# Patient Record
Sex: Male | Born: 1955 | Hispanic: No | Marital: Married | Smoking: Never smoker
Health system: Southern US, Community
[De-identification: ages and names within clinical notes are randomized; demographics above are authoritative.]

## PROBLEM LIST (undated history)

## (undated) DIAGNOSIS — I2510 Atherosclerotic heart disease of native coronary artery without angina pectoris: Secondary | ICD-10-CM

## (undated) DIAGNOSIS — E785 Hyperlipidemia, unspecified: Secondary | ICD-10-CM

## (undated) DIAGNOSIS — E119 Type 2 diabetes mellitus without complications: Secondary | ICD-10-CM

## (undated) DIAGNOSIS — I10 Essential (primary) hypertension: Secondary | ICD-10-CM

## (undated) HISTORY — DX: Hyperlipidemia, unspecified: E78.5

## (undated) HISTORY — PX: SMALL INTESTINE SURGERY: SHX150

## (undated) HISTORY — PX: OTHER SURGICAL HISTORY: SHX169

---

## 2006-05-24 ENCOUNTER — Emergency Department (HOSPITAL_COMMUNITY): Admission: EM | Admit: 2006-05-24 | Discharge: 2006-05-25 | Payer: Self-pay | Admitting: Emergency Medicine

## 2006-05-24 IMAGING — CT CT PELVIS W/O CM
2 of 4 series · 17 of 46 positions shown, 19 images · IV contrast (agent unspecified)
Comparison: None.

CLINICAL DATA: Lower bowel pain. History of nephrolithiasis and history of bowel
resection for small bowel obstruction.

ABDOMEN CT WITHOUT CONTRAST:
TECHNIQUE: Helical transaxial images of the abdomen and pelvis were obtained
without intravenous or oral contrast. The examination began above the kidneys
and extended inferiorly to the symphysis pubis.

[Series 2: stone_wo 5.0 b40f st · axial · 0.70mm/px · z∈[-514,-150]mm · 14 of 102 slices shown, 16 images]
[im 7/102  soft-tissue]
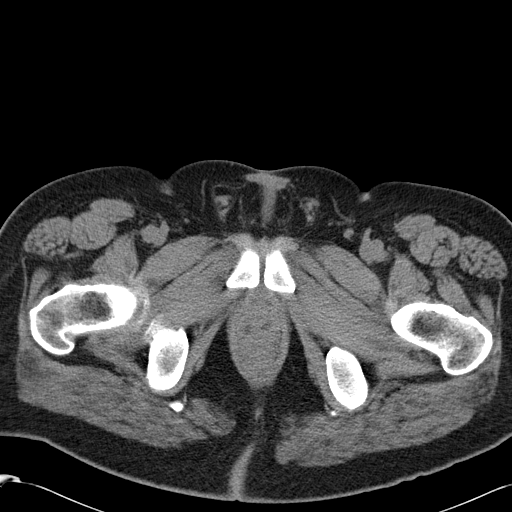
[im 7/102  bone]
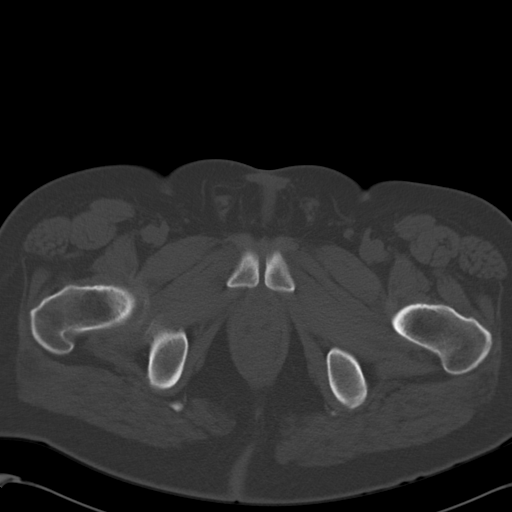
[im 14/102  soft-tissue]
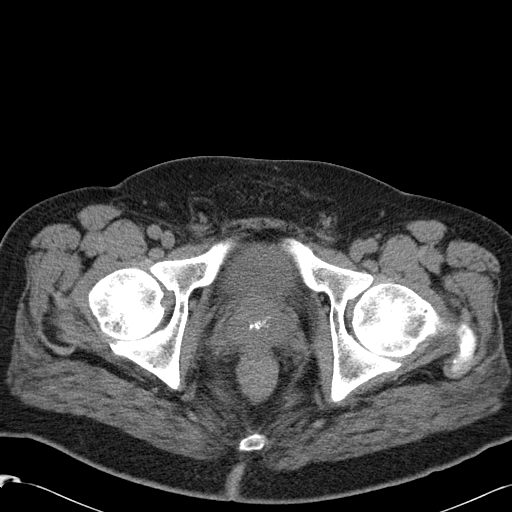
[im 21/102  soft-tissue]
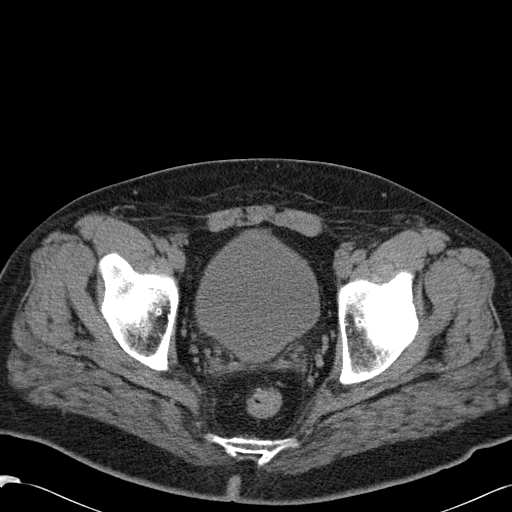
[im 28/102  soft-tissue]
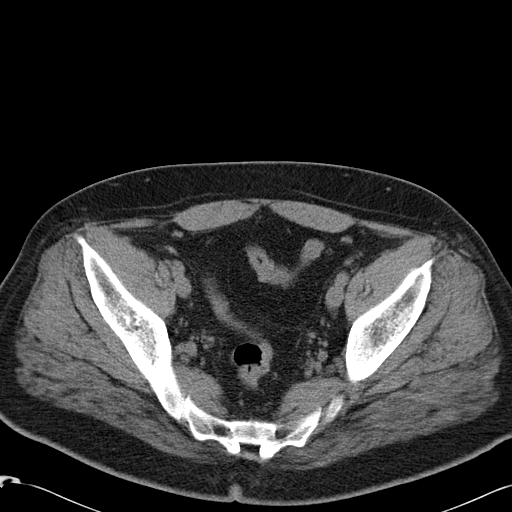
[im 35/102  soft-tissue]
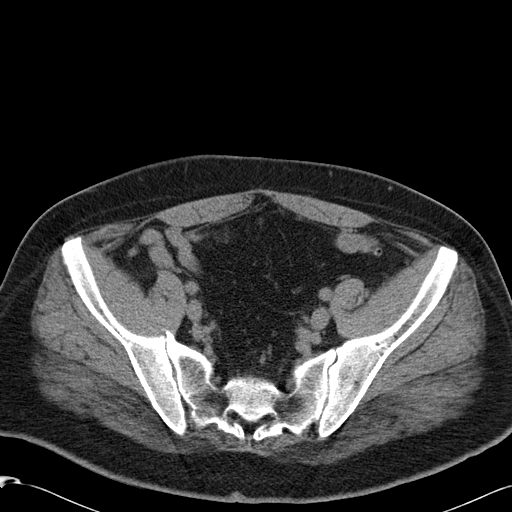
[im 42/102  soft-tissue]
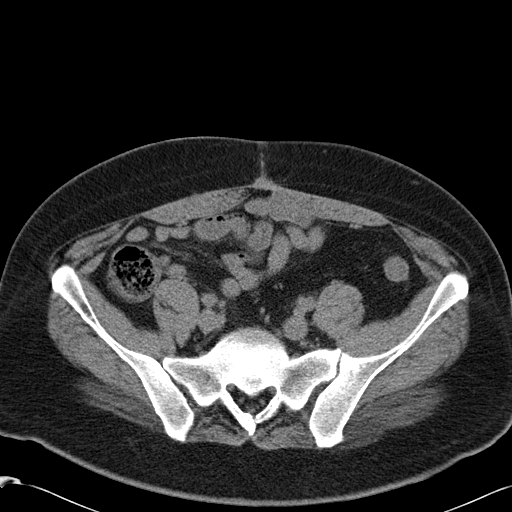
[im 49/102  soft-tissue]
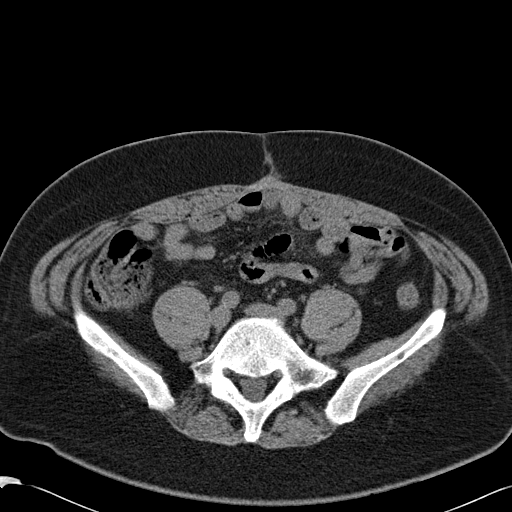
[im 56/102  soft-tissue]
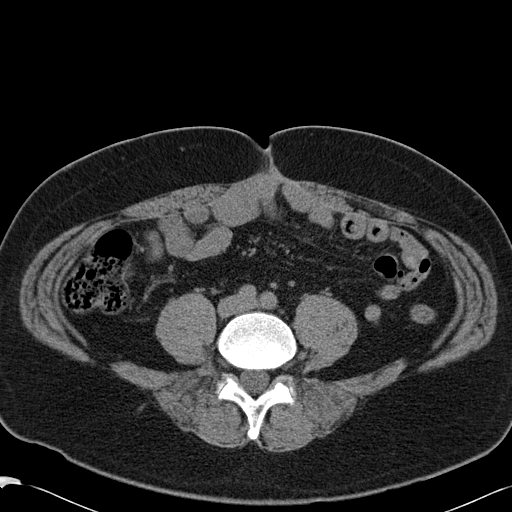
[im 63/102  soft-tissue]
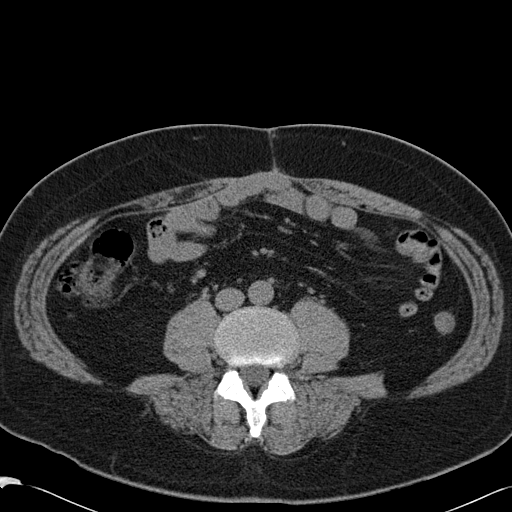
[im 63/102  bone]
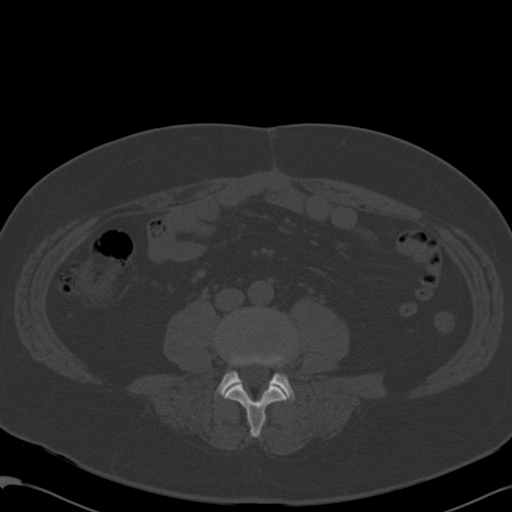
[im 70/102  soft-tissue]
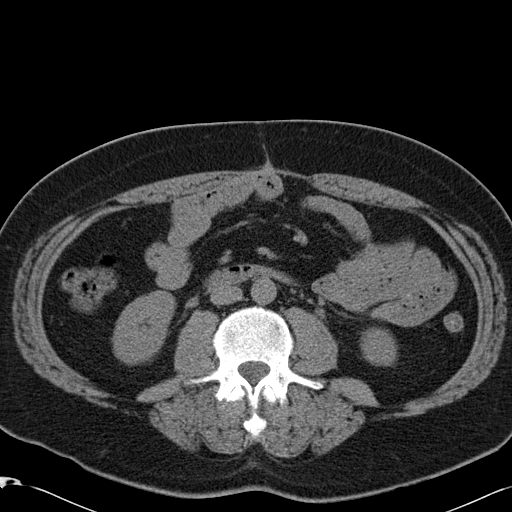
[im 77/102  soft-tissue]
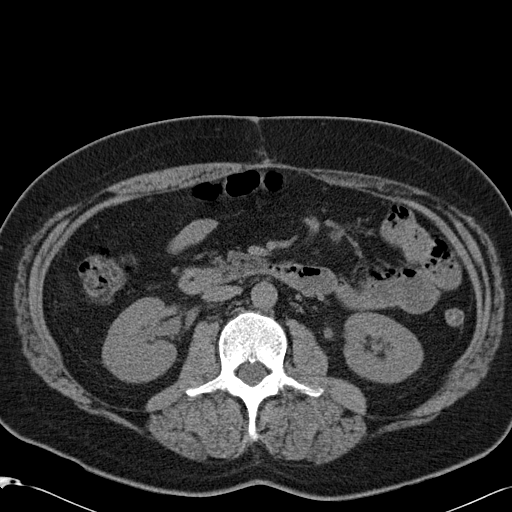
[im 84/102  soft-tissue]
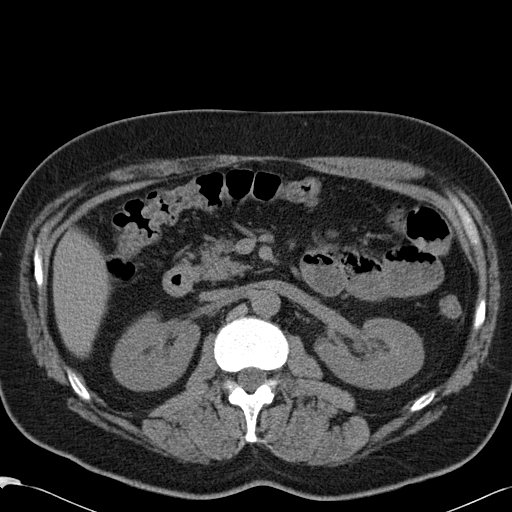
[im 91/102  soft-tissue]
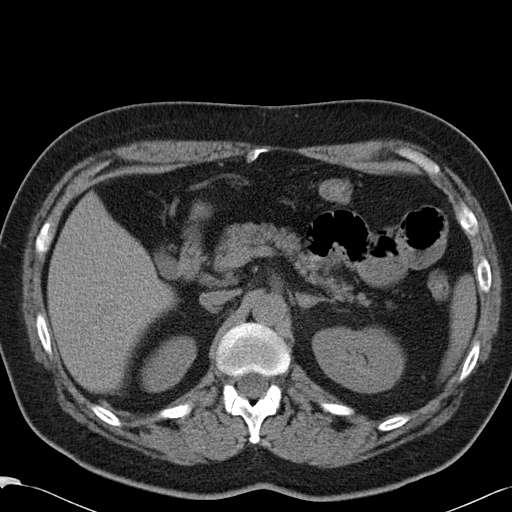
[im 98/102  soft-tissue]
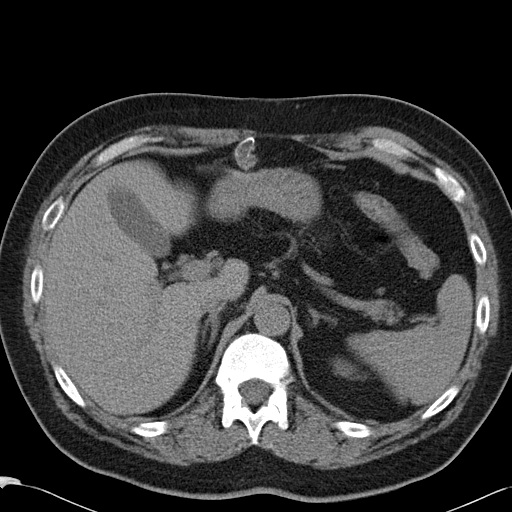

[Series 602: coronal · coronal · 0.83mm/px · 3 of 107 slices shown]
[im 36/107  soft-tissue]
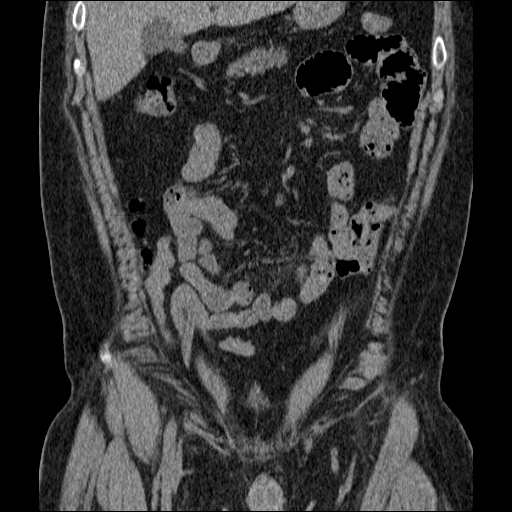
[im 48/107  soft-tissue]
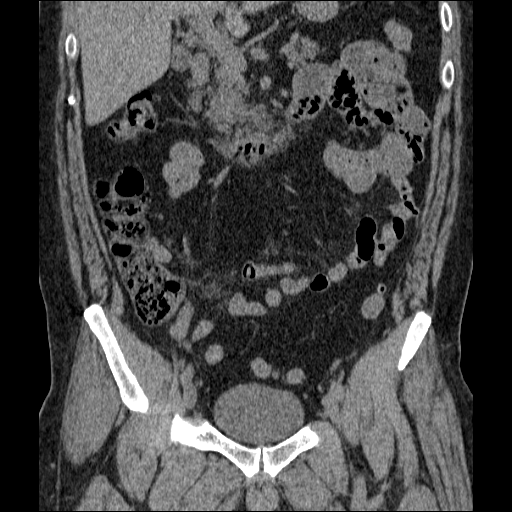
[im 59/107  soft-tissue]
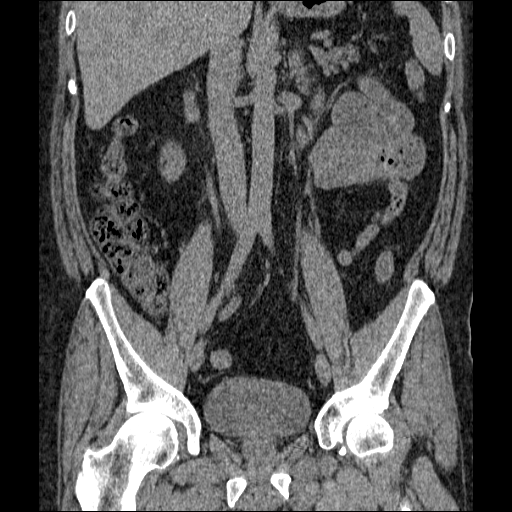

[17 of 46 positions shown; findings below may reference images not displayed]

FINDINGS: Left upper anterior abdominal calcification with an appearance
suggesting old, calcified fat necrosis. No renal or ureteral calculi and no
hydronephrosis seen. Minimally prominent loop of proximal jejunum, with
otherwise unremarkable bowel loops. Surgical scar. Multiple descending colon
diverticula without evidence of diverticulitis. Minimal soft tissue stranding in
the mesentery in the lower abdomen and upper pelvis, centrally.
IMPRESSION: 1. Minimal inferior mesenteric soft tissue stranding. This could represent
active inflammation or scarring related to previous surgery.

2. Minimally dilated loop of proximal jejunum. This could represent ileus,
partial obstruction or chronic dilatation related to previous obstruction.

3. No urinary tract calculi or hydronephrosis.

PELVIS CT WITHOUT CONTRAST:
FINDINGS: Central prostatic calcifications. No bladder or ureteral calculi seen.
Multiple sigmoid colon diverticula without evidence of diverticulitis.
IMPRESSION: No acute pelvic abnormality.

## 2013-11-25 ENCOUNTER — Emergency Department (HOSPITAL_COMMUNITY)
Admission: EM | Admit: 2013-11-25 | Discharge: 2013-11-25 | Disposition: A | Discharge status: Home / Self Care | Payer: Managed Care, Other (non HMO) | Attending: Emergency Medicine | Admitting: Emergency Medicine

## 2013-11-25 ENCOUNTER — Emergency Department (HOSPITAL_COMMUNITY): Payer: Managed Care, Other (non HMO)

## 2013-11-25 ENCOUNTER — Encounter (HOSPITAL_COMMUNITY): Payer: Self-pay | Admitting: Emergency Medicine

## 2013-11-25 DIAGNOSIS — R918 Other nonspecific abnormal finding of lung field: Secondary | ICD-10-CM

## 2013-11-25 DIAGNOSIS — R079 Chest pain, unspecified: Secondary | ICD-10-CM | POA: Diagnosis present

## 2013-11-25 DIAGNOSIS — Z79899 Other long term (current) drug therapy: Secondary | ICD-10-CM | POA: Insufficient documentation

## 2013-11-25 DIAGNOSIS — R911 Solitary pulmonary nodule: Secondary | ICD-10-CM | POA: Insufficient documentation

## 2013-11-25 DIAGNOSIS — I10 Essential (primary) hypertension: Secondary | ICD-10-CM | POA: Diagnosis not present

## 2013-11-25 DIAGNOSIS — K219 Gastro-esophageal reflux disease without esophagitis: Secondary | ICD-10-CM

## 2013-11-25 DIAGNOSIS — E119 Type 2 diabetes mellitus without complications: Secondary | ICD-10-CM | POA: Insufficient documentation

## 2013-11-25 HISTORY — DX: Essential (primary) hypertension: I10

## 2013-11-25 HISTORY — DX: Type 2 diabetes mellitus without complications: E11.9

## 2013-11-25 LAB — CBC
HCT: 39.6 % (ref 39.0–52.0)
Hemoglobin: 12.8 g/dL — ABNORMAL LOW (ref 13.0–17.0)
MCH: 26.4 pg (ref 26.0–34.0)
MCHC: 32.3 g/dL (ref 30.0–36.0)
MCV: 81.8 fL (ref 78.0–100.0)
PLATELETS: 297 10*3/uL (ref 150–400)
RBC: 4.84 MIL/uL (ref 4.22–5.81)
RDW: 13.8 % (ref 11.5–15.5)
WBC: 6.6 10*3/uL (ref 4.0–10.5)

## 2013-11-25 LAB — BASIC METABOLIC PANEL
ANION GAP: 13 (ref 5–15)
BUN: 14 mg/dL (ref 6–23)
CALCIUM: 9.6 mg/dL (ref 8.4–10.5)
CO2: 24 mEq/L (ref 19–32)
CREATININE: 1.07 mg/dL (ref 0.50–1.35)
Chloride: 102 mEq/L (ref 96–112)
GFR, EST AFRICAN AMERICAN: 86 mL/min — AB (ref >90)
GFR, EST NON AFRICAN AMERICAN: 74 mL/min — AB (ref >90)
Glucose, Bld: 152 mg/dL — ABNORMAL HIGH (ref 70–99)
Potassium: 3.6 mEq/L — ABNORMAL LOW (ref 3.7–5.3)
SODIUM: 139 meq/L (ref 137–147)

## 2013-11-25 LAB — I-STAT TROPONIN, ED
TROPONIN I, POC: 0 ng/mL (ref 0.00–0.08)
Troponin i, poc: 0 ng/mL (ref 0.00–0.08)

## 2013-11-25 LAB — D-DIMER, QUANTITATIVE: D-Dimer, Quant: 2.66 ug/mL-FEU — ABNORMAL HIGH (ref 0.00–0.48)

## 2013-11-25 IMAGING — CT CT ANGIO CHEST
1 of 3 series · 18 of 33 positions shown · IV contrast ([ID] OMNI 350)
Comparison: Prior radiograph from earlier the same day

CLINICAL DATA: Left chest pain and pressure, elevated D-dimer.

EXAM:
CT ANGIOGRAPHY CHEST WITH CONTRAST
TECHNIQUE: Multidetector CT imaging of the chest was performed using the
standard protocol during bolus administration of intravenous
contrast. Multiplanar CT image reconstructions and MIPs were
obtained to evaluate the vascular anatomy.
CONTRAST:  100mL OMNIPAQUE IOHEXOL 350 MG/ML SOLN

[Series 5: thins for pacs · axial · 0.70mm/px · z∈[-165,+55]mm · 18 of 252 slices shown]
[im 16/252  lung]
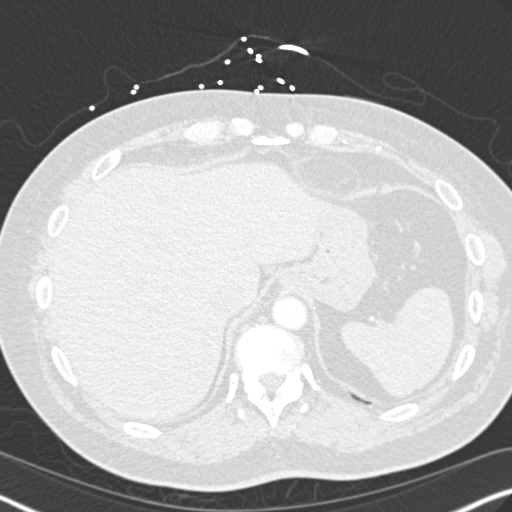
[im 32/252  mediastinal]
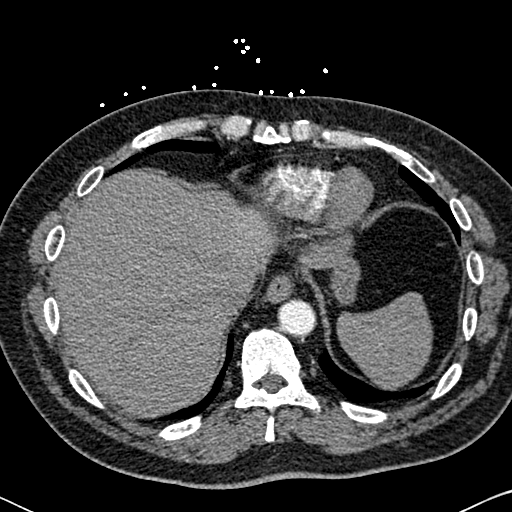
[im 48/252  lung]
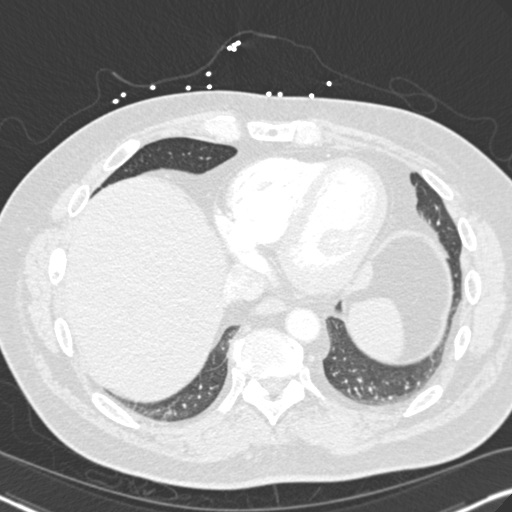
[im 63/252  mediastinal]
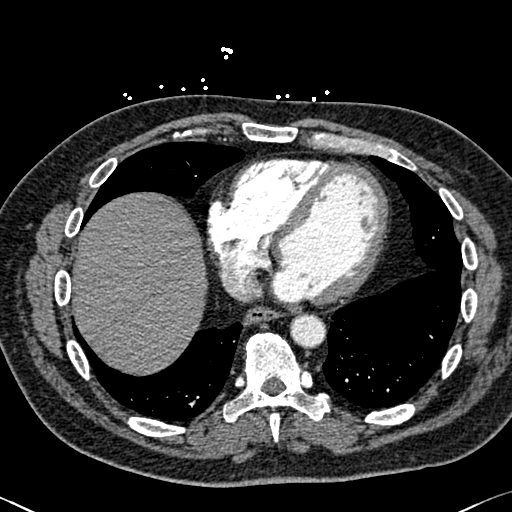
[im 79/252  lung]
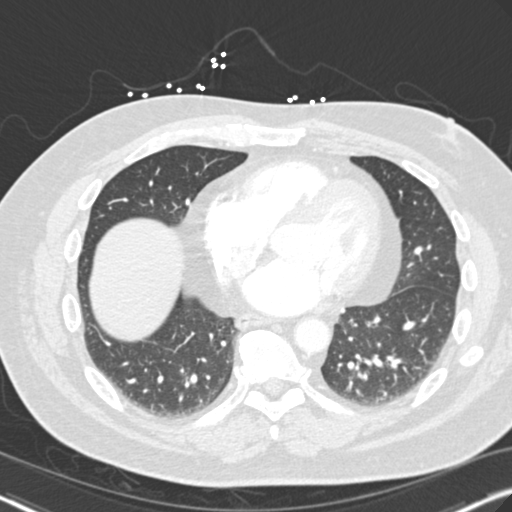
[im 84/252  mediastinal]
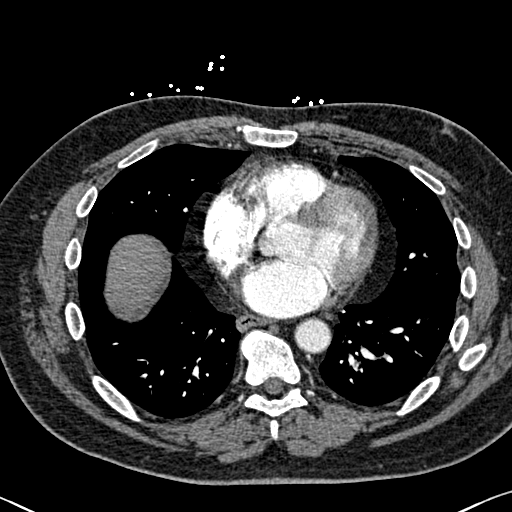
[im 95/252  lung]
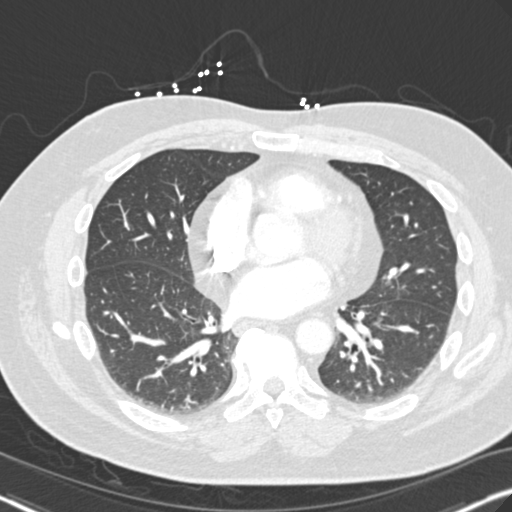
[im 110/252  mediastinal]
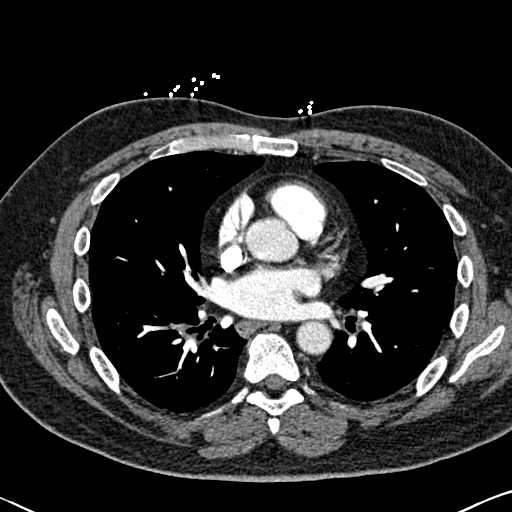
[im 117/252  lung]
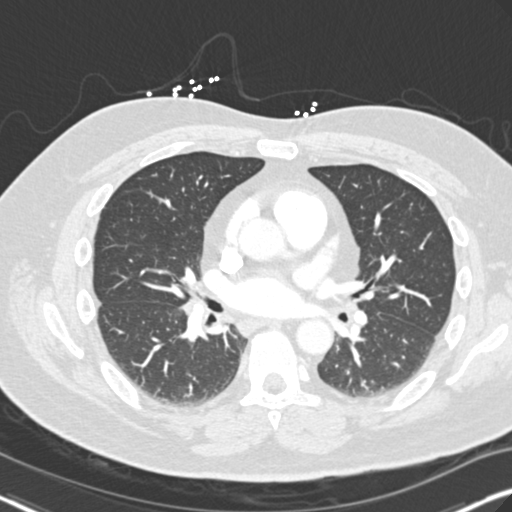
[im 126/252  mediastinal]
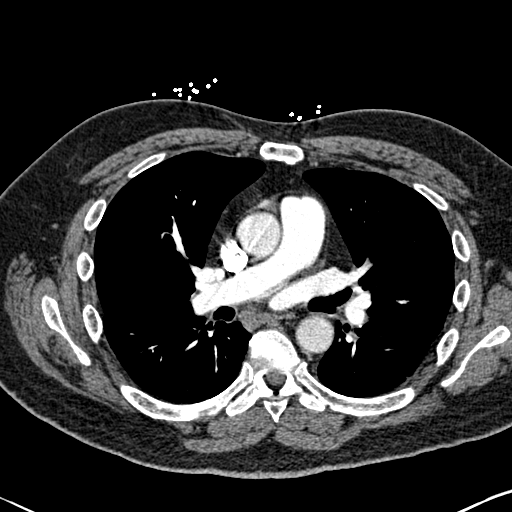
[im 142/252  lung]
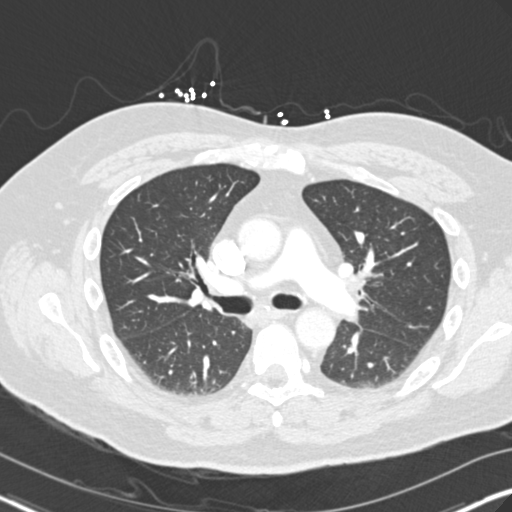
[im 157/252  mediastinal]
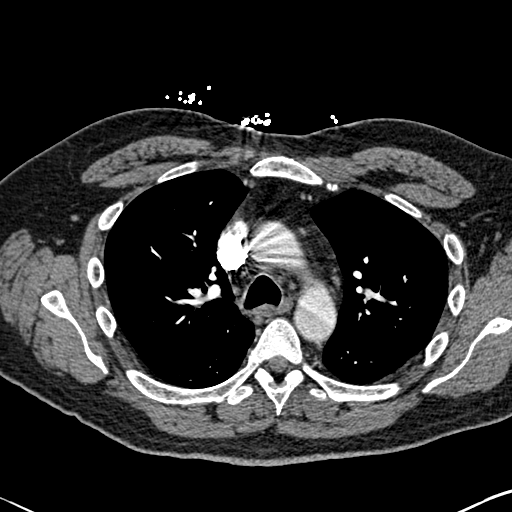
[im 168/252  lung]
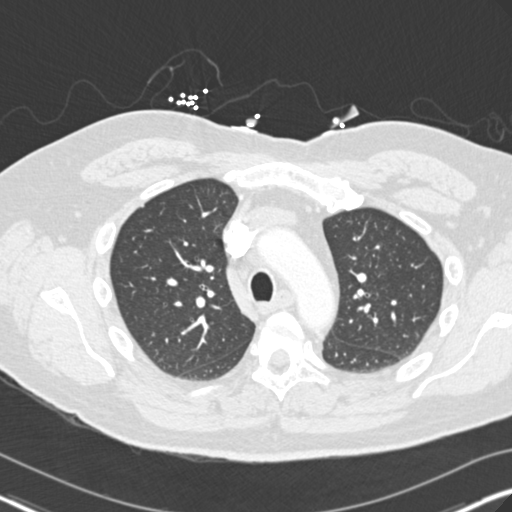
[im 173/252  mediastinal]
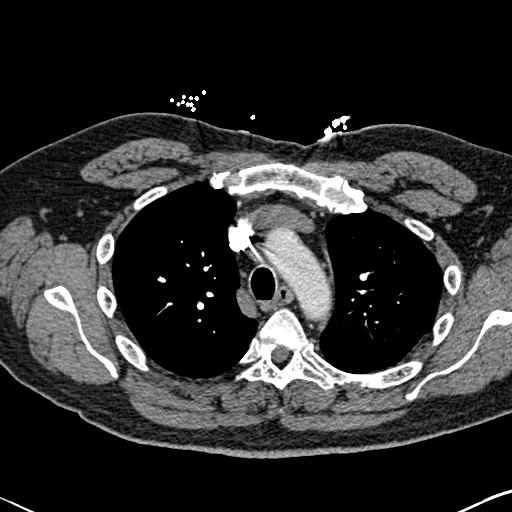
[im 189/252  lung]
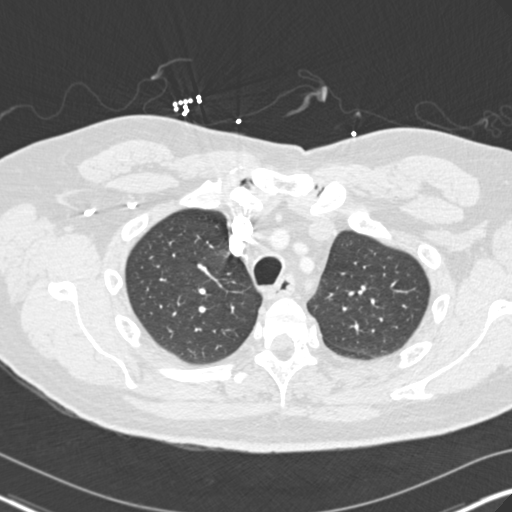
[im 204/252  mediastinal]
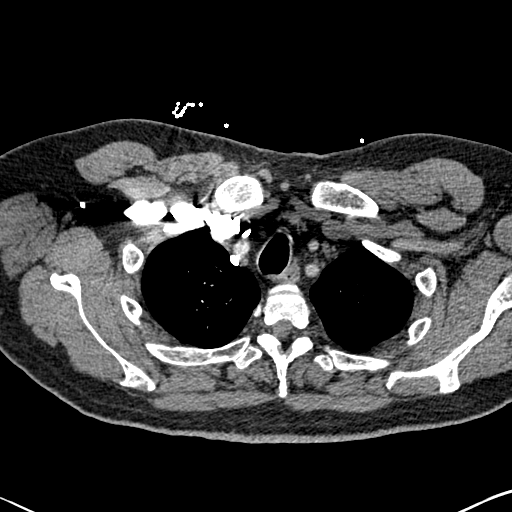
[im 220/252  lung]
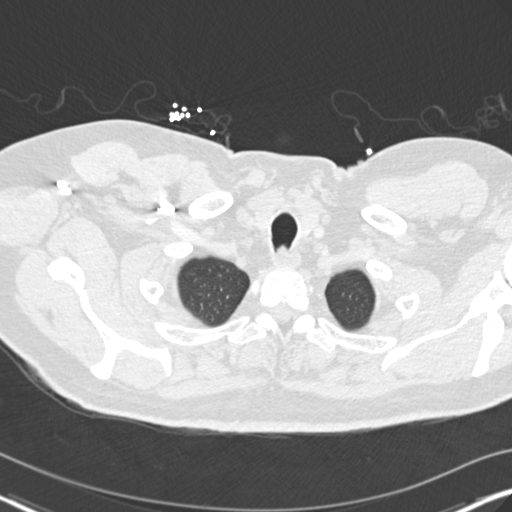
[im 236/252  mediastinal]
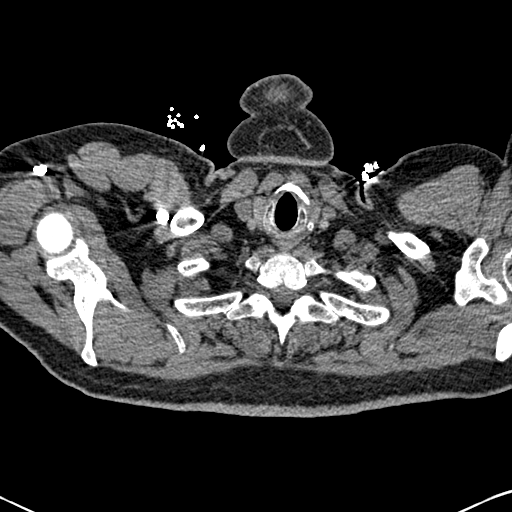

[18 of 33 positions shown; findings below may reference images not displayed]

FINDINGS: Thyroid gland is within normal limits.

No pathologically enlarged mediastinal, hilar, or axillary lymph
nodes are identified.

Intrathoracic aorta is of normal caliber and appearance. Great
vessels within normal limits.

Heart size within normal limits. Few scattered coronary artery
calcifications noted. No pericardial effusion.

Pulmonary arterial tree is well opacified. No filling defect to
suggest acute pulmonary embolism. Re-formatted imaging confirms
these findings.

The lungs are clear without focal infiltrate or pulmonary edema. No
pleural effusion. Two adjacent pulmonary nodules measuring up to 5
mm present within the right upper lobe (series 6, image 45, 47).
Additional 3 mm nodule present within the peripheral left upper lobe
(series 6, image 36) Mild subsegmental atelectasis seen dependently
within the lung bases. No pneumothorax.

Visualized upper abdomen is within normal limits.

No acute osseous abnormality. No worrisome lytic or blastic osseous
lesions. Scattered multilevel degenerative changes present within
the visualized spine.

Review of the MIP images confirms the above findings.
IMPRESSION: 1. No CT evidence of acute pulmonary embolism or other acute
cardiopulmonary abnormality.
2. Subcentimeter pulmonary nodules measuring up to 5 mm within the
bilateral upper lobes as above. If the patient is at high risk for
bronchogenic carcinoma, follow-up chest CT at 6-12 months is
recommended. If the patient is at low risk for bronchogenic
carcinoma, follow-up chest CT at 12 months is recommended. This
recommendation follows the consensus statement: Guidelines for
Management of Small Pulmonary Nodules Detected on CT Scans: A
Statement from the [HOSPITAL] as published in Radiology

## 2013-11-25 IMAGING — CR DG CHEST 2V
2 series · 2 of 2 positions shown · non-contrast
Comparison: None.

CLINICAL DATA: Sudden onset of chest pain.

EXAM:
CHEST  2 VIEW

[w chest pa]
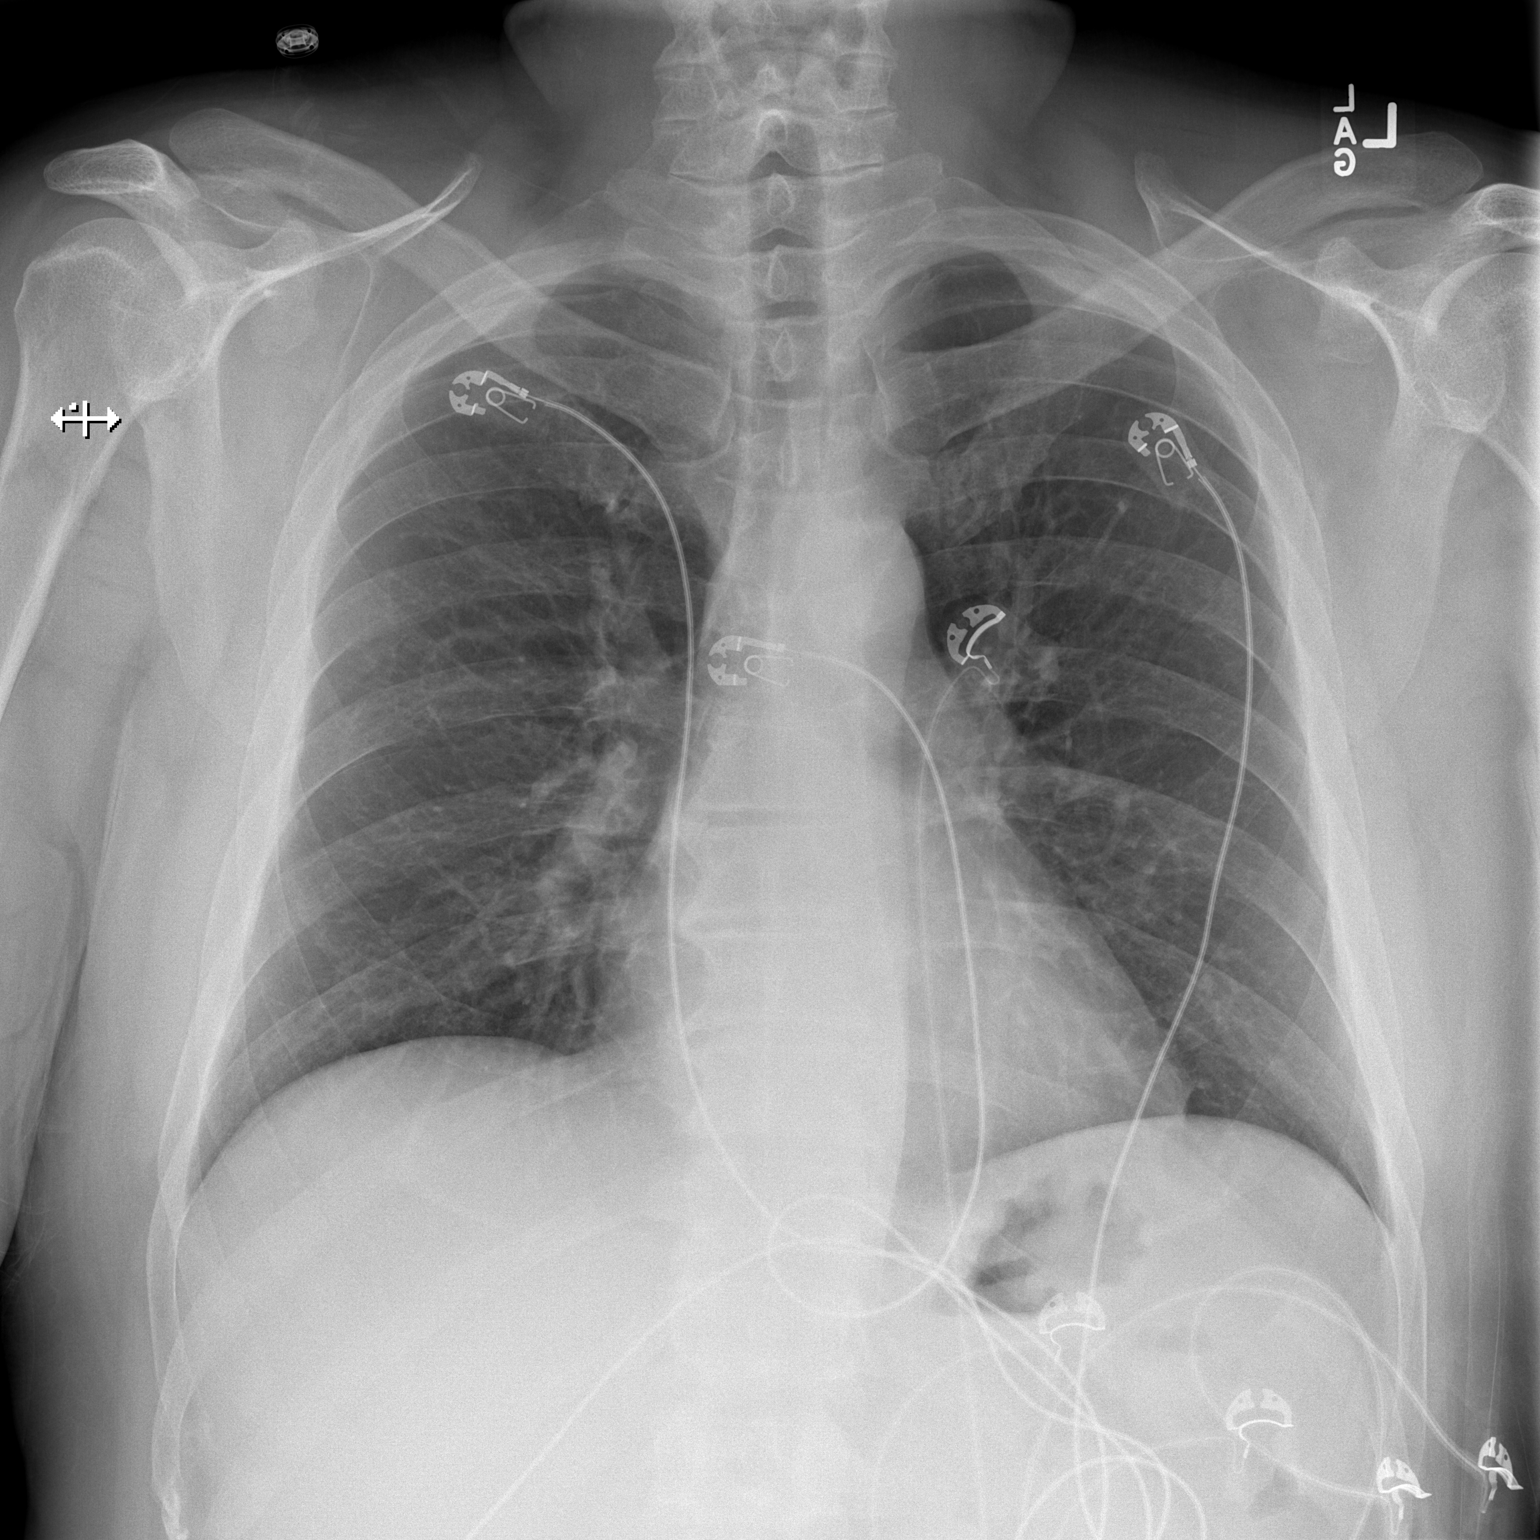

[w chest lat]
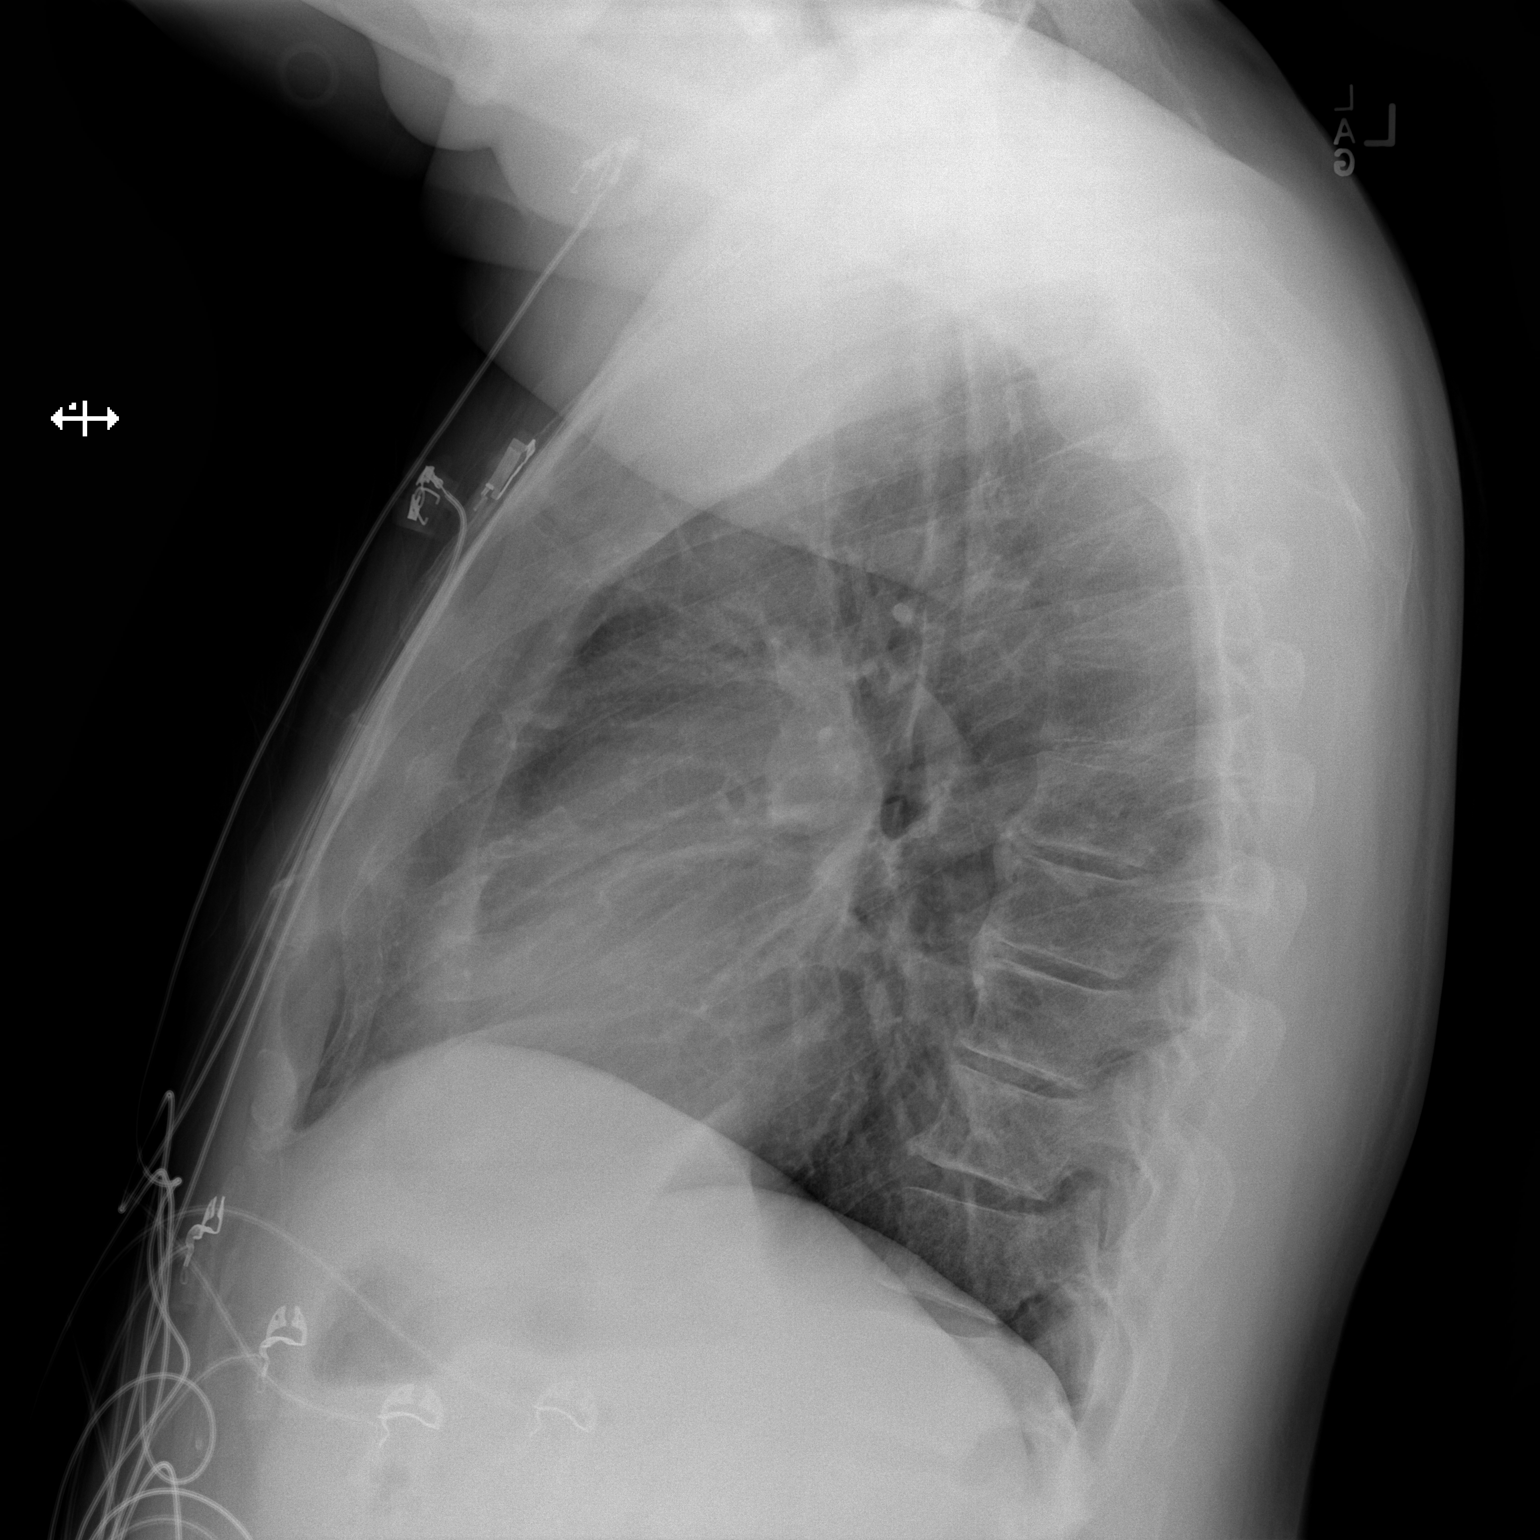

[2 of 2 positions shown; findings below may reference images not displayed]

FINDINGS: The lungs are well-aerated and clear. There is no evidence of focal
opacification, pleural effusion or pneumothorax.

The heart is normal in size; the mediastinal contour is within
normal limits. No acute osseous abnormalities are seen.
IMPRESSION: No acute cardiopulmonary process seen.

## 2013-11-25 MED ORDER — IOHEXOL 350 MG/ML SOLN
100.0000 mL | Freq: Once | INTRAVENOUS | Status: AC | PRN
  Administered 2013-11-25: 100 mL via INTRAVENOUS

## 2013-11-25 MED ORDER — KETOROLAC TROMETHAMINE 30 MG/ML IJ SOLN
30.0000 mg | Freq: Once | INTRAMUSCULAR | Status: AC
  Administered 2013-11-25: 30 mg via INTRAVENOUS
  Filled 2013-11-25: qty 1

## 2013-11-25 MED ORDER — OMEPRAZOLE 20 MG PO CPDR: 20.0000 mg | DELAYED_RELEASE_CAPSULE | Freq: Every day | ORAL | Status: DC

## 2013-11-25 MED ORDER — GI COCKTAIL ~~LOC~~
30.0000 mL | Freq: Once | ORAL | Status: AC
  Administered 2013-11-25: 30 mL via ORAL
  Filled 2013-11-25: qty 30

## 2013-11-25 NOTE — ED Notes (Signed)
Returned from CT.
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
## 2013-11-25 NOTE — ED Notes (Signed)
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

## 2013-11-25 NOTE — ED Notes (Signed)
Patient transported to CT 

## 2013-12-06 ENCOUNTER — Other Ambulatory Visit (HOSPITAL_COMMUNITY): Payer: Self-pay | Admitting: Cardiology

## 2013-12-06 DIAGNOSIS — R0789 Other chest pain: Secondary | ICD-10-CM

## 2013-12-09 HISTORY — PX: CARDIAC CATHETERIZATION: SHX172

## 2013-12-10 ENCOUNTER — Encounter (HOSPITAL_COMMUNITY)
Admission: RE | Admit: 2013-12-10 | Discharge: 2013-12-10 | Disposition: A | Discharge status: Home / Self Care | Payer: Managed Care, Other (non HMO) | Source: Ambulatory Visit | Attending: Cardiology | Admitting: Cardiology

## 2013-12-10 ENCOUNTER — Encounter (HOSPITAL_COMMUNITY): Payer: Managed Care, Other (non HMO)

## 2013-12-10 DIAGNOSIS — R0789 Other chest pain: Secondary | ICD-10-CM | POA: Insufficient documentation

## 2013-12-10 LAB — PROTIME-INR
INR: 1.05 (ref 0.00–1.49)
Prothrombin Time: 13.7 seconds (ref 11.6–15.2)

## 2013-12-10 LAB — BASIC METABOLIC PANEL
Anion gap: 13 (ref 5–15)
BUN: 10 mg/dL (ref 6–23)
CALCIUM: 9.1 mg/dL (ref 8.4–10.5)
CHLORIDE: 101 meq/L (ref 96–112)
CO2: 26 meq/L (ref 19–32)
Creatinine, Ser: 0.91 mg/dL (ref 0.50–1.35)
GFR calc Af Amer: >90 mL/min (ref >90)
GFR calc non Af Amer: >90 mL/min (ref >90)
Glucose, Bld: 151 mg/dL — ABNORMAL HIGH (ref 70–99)
Potassium: 4.3 mEq/L (ref 3.7–5.3)
Sodium: 140 mEq/L (ref 137–147)

## 2013-12-10 LAB — CBC
HEMATOCRIT: 37 % — AB (ref 39.0–52.0)
Hemoglobin: 12 g/dL — ABNORMAL LOW (ref 13.0–17.0)
MCH: 26.8 pg (ref 26.0–34.0)
MCHC: 32.4 g/dL (ref 30.0–36.0)
MCV: 82.8 fL (ref 78.0–100.0)
Platelets: 250 10*3/uL (ref 150–400)
RBC: 4.47 MIL/uL (ref 4.22–5.81)
RDW: 13.9 % (ref 11.5–15.5)
WBC: 4.6 10*3/uL (ref 4.0–10.5)

## 2013-12-10 MED ORDER — TECHNETIUM TC 99M SESTAMIBI GENERIC - CARDIOLITE
10.0000 | Freq: Once | INTRAVENOUS | Status: AC | PRN
  Administered 2013-12-10: 10 via INTRAVENOUS

## 2013-12-10 MED ORDER — TECHNETIUM TC 99M SESTAMIBI GENERIC - CARDIOLITE
30.0000 | Freq: Once | INTRAVENOUS | Status: AC | PRN
  Administered 2013-12-10: 30 via INTRAVENOUS

## 2013-12-10 MED ORDER — NITROGLYCERIN 0.4 MG SL SUBL
0.4000 mg | SUBLINGUAL_TABLET | SUBLINGUAL | Status: DC | PRN
  Administered 2013-12-10: 0.4 mg via SUBLINGUAL

## 2013-12-10 MED ORDER — NITROGLYCERIN 0.4 MG SL SUBL
SUBLINGUAL_TABLET | SUBLINGUAL | Status: AC
  Filled 2013-12-10: qty 1

## 2013-12-10 NOTE — Progress Notes (Signed)
Labs ordered per Dr. Sharyn LullHarwani, [facility] then discontinued IV per protocol, catheter intact, occlusive dsg applied

## 2013-12-13 ENCOUNTER — Encounter (HOSPITAL_COMMUNITY): Payer: Self-pay | Admitting: Pharmacy Technician

## 2013-12-14 ENCOUNTER — Encounter (HOSPITAL_COMMUNITY): Payer: Self-pay | Admitting: General Practice

## 2013-12-14 ENCOUNTER — Encounter (HOSPITAL_COMMUNITY)
Admission: RE | Disposition: A | Discharge status: Home / Self Care | Payer: Managed Care, Other (non HMO) | Source: Ambulatory Visit | Attending: Cardiology

## 2013-12-14 ENCOUNTER — Ambulatory Visit (HOSPITAL_COMMUNITY)
Admission: RE | Admit: 2013-12-14 | Discharge: 2013-12-15 | Disposition: A | Discharge status: Home / Self Care | Payer: Managed Care, Other (non HMO) | Source: Ambulatory Visit | Attending: Cardiology | Admitting: Cardiology

## 2013-12-14 DIAGNOSIS — E119 Type 2 diabetes mellitus without complications: Secondary | ICD-10-CM | POA: Insufficient documentation

## 2013-12-14 DIAGNOSIS — I200 Unstable angina: Secondary | ICD-10-CM | POA: Diagnosis present

## 2013-12-14 DIAGNOSIS — I25119 Atherosclerotic heart disease of native coronary artery with unspecified angina pectoris: Secondary | ICD-10-CM | POA: Insufficient documentation

## 2013-12-14 DIAGNOSIS — I119 Hypertensive heart disease without heart failure: Secondary | ICD-10-CM | POA: Insufficient documentation

## 2013-12-14 DIAGNOSIS — Z79899 Other long term (current) drug therapy: Secondary | ICD-10-CM | POA: Insufficient documentation

## 2013-12-14 DIAGNOSIS — D62 Acute posthemorrhagic anemia: Secondary | ICD-10-CM | POA: Diagnosis not present

## 2013-12-14 DIAGNOSIS — Z8249 Family history of ischemic heart disease and other diseases of the circulatory system: Secondary | ICD-10-CM | POA: Diagnosis not present

## 2013-12-14 DIAGNOSIS — E780 Pure hypercholesterolemia: Secondary | ICD-10-CM | POA: Insufficient documentation

## 2013-12-14 DIAGNOSIS — R079 Chest pain, unspecified: Secondary | ICD-10-CM | POA: Diagnosis present

## 2013-12-14 DIAGNOSIS — Z95820 Peripheral vascular angioplasty status with implants and grafts: Secondary | ICD-10-CM

## 2013-12-14 HISTORY — PX: CORONARY STENT PLACEMENT: SHX1402

## 2013-12-14 HISTORY — DX: Atherosclerotic heart disease of native coronary artery without angina pectoris: I25.10

## 2013-12-14 HISTORY — PX: LEFT HEART CATHETERIZATION WITH CORONARY ANGIOGRAM: SHX5451

## 2013-12-14 LAB — GLUCOSE, CAPILLARY
GLUCOSE-CAPILLARY: 127 mg/dL — AB (ref 70–99)
Glucose-Capillary: 105 mg/dL — ABNORMAL HIGH (ref 70–99)
Glucose-Capillary: 109 mg/dL — ABNORMAL HIGH (ref 70–99)
Glucose-Capillary: 189 mg/dL — ABNORMAL HIGH (ref 70–99)
Glucose-Capillary: 158 mg/dL — ABNORMAL HIGH (ref 70–99)

## 2013-12-14 LAB — HEMOGLOBIN A1C
HEMOGLOBIN A1C: 7.1 % — AB (ref <5.7)
Mean Plasma Glucose: 157 mg/dL — ABNORMAL HIGH (ref <117)

## 2013-12-14 LAB — POCT ACTIVATED CLOTTING TIME: Activated Clotting Time: 366 seconds

## 2013-12-14 SURGERY — LEFT HEART CATHETERIZATION WITH CORONARY ANGIOGRAM
Anesthesia: LOCAL

## 2013-12-14 MED ORDER — SODIUM CHLORIDE 0.9 % IJ SOLN: 3.0000 mL | Freq: Two times a day (BID) | INTRAMUSCULAR | Status: DC

## 2013-12-14 MED ORDER — DIAZEPAM 5 MG PO TABS
5.0000 mg | ORAL_TABLET | Freq: Once | ORAL | Status: AC
Start: 2013-12-14 — End: 2013-12-14
  Administered 2013-12-14: 5 mg via ORAL

## 2013-12-14 MED ORDER — LISINOPRIL 5 MG PO TABS
5.0000 mg | ORAL_TABLET | Freq: Every day | ORAL | Status: DC
  Administered 2013-12-15: 10:00:00 5 mg via ORAL
  Filled 2013-12-14 (×3): qty 1

## 2013-12-14 MED ORDER — SODIUM CHLORIDE 0.9 % IV SOLN: 250.0000 mL | INTRAVENOUS | Status: DC | PRN

## 2013-12-14 MED ORDER — DIAZEPAM 5 MG PO TABS
ORAL_TABLET | ORAL | Status: AC
  Filled 2013-12-14: qty 1

## 2013-12-14 MED ORDER — SODIUM CHLORIDE 0.9 % IV SOLN
0.2500 mg/kg/h | INTRAVENOUS | Status: AC
  Administered 2013-12-14: 0.25 mg/kg/h via INTRAVENOUS
  Filled 2013-12-14: qty 250

## 2013-12-14 MED ORDER — SODIUM CHLORIDE 0.9 % IJ SOLN: 3.0000 mL | INTRAMUSCULAR | Status: DC | PRN

## 2013-12-14 MED ORDER — PANTOPRAZOLE SODIUM 40 MG PO TBEC
40.0000 mg | DELAYED_RELEASE_TABLET | Freq: Every day | ORAL | Status: DC
  Administered 2013-12-14 – 2013-12-15 (×2): 40 mg via ORAL
  Filled 2013-12-14 (×2): qty 1

## 2013-12-14 MED ORDER — ASPIRIN 81 MG PO CHEW
CHEWABLE_TABLET | ORAL | Status: AC
  Filled 2013-12-14: qty 1

## 2013-12-14 MED ORDER — TICAGRELOR 90 MG PO TABS
90.0000 mg | ORAL_TABLET | Freq: Two times a day (BID) | ORAL | Status: DC
  Administered 2013-12-14 – 2013-12-15 (×2): 90 mg via ORAL
  Filled 2013-12-14 (×3): qty 1

## 2013-12-14 MED ORDER — ASPIRIN 81 MG PO CHEW
81.0000 mg | CHEWABLE_TABLET | Freq: Every day | ORAL | Status: DC
Start: 2013-12-15 — End: 2013-12-15
  Administered 2013-12-15: 10:00:00 81 mg via ORAL
  Filled 2013-12-14: qty 1

## 2013-12-14 MED ORDER — SODIUM CHLORIDE 0.9 % IV SOLN
INTRAVENOUS | Status: AC
  Administered 2013-12-14: 12:00:00 via INTRAVENOUS

## 2013-12-14 MED ORDER — ATORVASTATIN CALCIUM 80 MG PO TABS
80.0000 mg | ORAL_TABLET | Freq: Every day | ORAL | Status: DC
  Filled 2013-12-14 (×2): qty 1

## 2013-12-14 MED ORDER — LIDOCAINE HCL (PF) 1 % IJ SOLN
INTRAMUSCULAR | Status: AC
  Filled 2013-12-14: qty 30

## 2013-12-14 MED ORDER — ADENOSINE 12 MG/4ML IV SOLN
16.0000 mL | Freq: Once | INTRAVENOUS | Status: DC
  Filled 2013-12-14: qty 16

## 2013-12-14 MED ORDER — SODIUM CHLORIDE 0.9 % IV SOLN: INTRAVENOUS | Status: DC

## 2013-12-14 MED ORDER — INSULIN ASPART 100 UNIT/ML ~~LOC~~ SOLN
0.0000 [IU] | Freq: Three times a day (TID) | SUBCUTANEOUS | Status: DC
  Administered 2013-12-14: 17:00:00 2 [IU] via SUBCUTANEOUS

## 2013-12-14 MED ORDER — ATROPINE SULFATE 0.1 MG/ML IJ SOLN
INTRAMUSCULAR | Status: AC
  Filled 2013-12-14: qty 10

## 2013-12-14 MED ORDER — BIVALIRUDIN 250 MG IV SOLR
INTRAVENOUS | Status: AC
  Filled 2013-12-14: qty 250

## 2013-12-14 MED ORDER — METOPROLOL TARTRATE 12.5 MG HALF TABLET
12.5000 mg | ORAL_TABLET | Freq: Two times a day (BID) | ORAL | Status: DC
Start: 2013-12-14 — End: 2013-12-15
  Administered 2013-12-14 – 2013-12-15 (×2): 12.5 mg via ORAL
  Filled 2013-12-14 (×4): qty 1

## 2013-12-14 MED ORDER — TICAGRELOR 90 MG PO TABS
ORAL_TABLET | ORAL | Status: AC
  Filled 2013-12-14: qty 1

## 2013-12-14 MED ORDER — ASPIRIN 81 MG PO CHEW
81.0000 mg | CHEWABLE_TABLET | ORAL | Status: AC
  Administered 2013-12-14: 81 mg via ORAL

## 2013-12-14 MED ORDER — OXYCODONE-ACETAMINOPHEN 5-325 MG PO TABS
1.0000 | ORAL_TABLET | ORAL | Status: DC | PRN
  Administered 2013-12-14 (×2): 1 via ORAL
  Filled 2013-12-14 (×2): qty 1

## 2013-12-14 MED ORDER — NITROGLYCERIN IN D5W 200-5 MCG/ML-% IV SOLN
5.0000 ug/min | INTRAVENOUS | Status: DC
  Administered 2013-12-14: 5 ug/min via INTRAVENOUS
  Filled 2013-12-14: qty 250

## 2013-12-14 MED ORDER — FENTANYL CITRATE 0.05 MG/ML IJ SOLN
INTRAMUSCULAR | Status: AC
  Filled 2013-12-14: qty 2

## 2013-12-14 MED ORDER — NITROGLYCERIN 1 MG/10 ML FOR IR/CATH LAB
INTRA_ARTERIAL | Status: AC
  Filled 2013-12-14: qty 10

## 2013-12-14 MED ORDER — ONDANSETRON HCL 4 MG/2ML IJ SOLN: 4.0000 mg | Freq: Four times a day (QID) | INTRAMUSCULAR | Status: DC | PRN

## 2013-12-14 MED ORDER — GLIPIZIDE 10 MG PO TABS
10.0000 mg | ORAL_TABLET | Freq: Two times a day (BID) | ORAL | Status: DC
  Administered 2013-12-14 – 2013-12-15 (×2): 10 mg via ORAL
  Filled 2013-12-14 (×4): qty 1

## 2013-12-14 MED ORDER — HEPARIN (PORCINE) IN NACL 2-0.9 UNIT/ML-% IJ SOLN
INTRAMUSCULAR | Status: AC
  Filled 2013-12-14: qty 1500

## 2013-12-14 MED ORDER — ACETAMINOPHEN 325 MG PO TABS: 650.0000 mg | ORAL_TABLET | ORAL | Status: DC | PRN

## 2013-12-14 MED ORDER — MIDAZOLAM HCL 2 MG/2ML IJ SOLN
INTRAMUSCULAR | Status: AC
  Filled 2013-12-14: qty 2

## 2013-12-14 NOTE — H&P (Signed)
   dictated H&P in the chart needs to be scanned

## 2013-12-14 NOTE — Interval H&P Note (Signed)
Cath Lab Visit (complete for each Cath Lab visit)  Clinical Evaluation Leading to the Procedure:   ACS: No.  Non-ACS:    Anginal Classification: CCS III  Anti-ischemic medical therapy: Maximal Therapy (2 or more classes of medications)  Non-Invasive Test Results: Intermediate-risk stress test findings: cardiac mortality 1-3%/year  Prior CABG: No previous CABG      History and Physical Interval Note:  12/14/2013 7:33 AM  Danise EdgeJesse Susan  has presented today for surgery, with the diagnosis of cp/abnormal stress test  The various methods of treatment have been discussed with the patient and family. After consideration of risks, benefits and other options for treatment, the patient has consented to  Procedure(s): LEFT HEART CATHETERIZATION WITH CORONARY ANGIOGRAM (N/A) as a surgical intervention .  The patient's history has been reviewed, patient examined, no change in status, stable for surgery.  I have reviewed the patient's chart and labs.  Questions were answered to the patient's satisfaction.     Robynn PaneHARWANI,Carmon Brigandi N

## 2013-12-14 NOTE — Cardiovascular Report (Signed)
NAMELAQUAN, BEIER                 ACCOUNT NO.:  [id number]  MEDICAL RECORD NO.:  [id number]  LOCATION:  6C03C                        FACILITY:  [facility]  PHYSICIAN:  Eduardo Osier. Sharyn Lull, M.D. DATE OF BIRTH:  July 01, 1955  DATE OF PROCEDURE:  12/14/2013 DATE OF DISCHARGE:                           CARDIAC CATHETERIZATION   PROCEDURE PERFORMED: 1. Left cardiac catheterization with selective left and right coronary     angiography, left ventriculography via right groin using Judkins     technique. 2. Measurement of fractional flow reserve in LAD. 3. Successful PTCA to ostial and proximal LAD using 3.0 x 10 mm long     AngioSculpt balloon for predilatation. 4. Successful deployment of 3.5 x 23 mm long Xience Alpine drug-     eluting stent in proximal and ostial LAD. 5. Successful postdilatation of this stent using 3.5 x 15 mm long Bliss     Trek balloon.  INDICATION FOR THE PROCEDURE:  Mr. Tafoya is a 58 year old black male with past medical history significant for hypertension, type 2 diabetes mellitus, hypercholesteremia, positive family history of coronary artery disease, who complains of recurrent retrosternal and left-sided chest pain described as pressure as if someone is standing on his chest grade 10/10 associated with shortness of breath and dizziness.  He states chest pain resolves with rest and sublingual nitroglycerin.  He denies any nausea, vomiting, diaphoresis.  Denies PND, orthopnea, or leg swelling.  Denies palpitation, lightheadedness, or syncope.  The patient underwent stress Myoview on 10/02 which showed marked stress-induced ST- T wave abnormalities in anterolateral and inferior leads.  Myoview scan showed apical ischemia with EF of 51%.  The patient states he had recurrent chest pain this weekend also requiring sublingual nitro with relief of chest pain. Due to typical anginal chest pain, markedly abnormal stress test, stress Myoview discussed with the patient regarding  left cath, possible PTCA stenting, its risks and benefits, i.e., death, MI, stroke, need for emergency CABG, local vascular complications, etc. and consented for PCI.  DESCRIPTION OF PROCEDURE:  After obtaining the informed consent, the patient was brought to the cath lab and was placed on fluoroscopy table. Right groin was prepped and draped in usual fashion.  With the help of thin wall needle, a 6-French arterial sheath was placed.  The sheath was aspirated and flushed.  A 6-French left Judkins catheter was advanced over the wire under fluoroscopic guidance up to the ascending aorta. Wire was pulled out.  The catheter was aspirated and connected to the Manifold.  Catheter was further advanced and engaged into left coronary ostium.  Multiple views of the left system were taken.  Next, the catheter was disengaged and was pulled out over the wire and was replaced with 6-French right Judkins catheter, which was advanced over the wire under fluoroscopic guidance up to the ascending aorta.  Wire was pulled out.  The catheter was aspirated and connected to the Manifold.  Catheter was further advanced and engaged into right coronary ostium.  Multiple views of the right system were taken.  Next, catheter was disengaged and was pulled out over the wire and was replaced with 6- French pigtail catheter, which was advanced over the  wire under fluoroscopic guidance up to the ascending aorta.  Wire was pulled out. The catheter was aspirated and connected to the Manifold.  Catheter was further advanced across the aortic valve into the LV.  LV pressures were recorded.  Next, LV graft was done in 30-degree RAO position.  Post- angiographic pressures were recorded from LV and then pullback pressures were recorded from the aorta.  There was no gradient across the aortic valve.  Next, the pigtail catheter was pulled out over the wire. Sheaths were aspirated and flushed.  FINDINGS:  LV showed good LV  systolic function and mild LVH, EF of approximately 50-55%.  Left main was patent.  LAD has ostial 60-65% and proximal 50-60% stenosis and 30-40% distal stenosis.  Diagonal 1 has 40% to 50% ostial stenosis.  Diagonal 2 and 3 were very small.  Left circumflex has 20-35% proximal and mid stenosis.  OM1 has 20-25% proximal stenosis.  OM2 was very, very small.  OM3 was small which has 20% to 25% ostial stenosis.  RCA has 15-20% proximal and 40-50% mid and distal junction stenosis.  PDA has 20-25% mid stenosis.  PLV branch is patent which is small.  Fractional flow reserve was done to evaluate the significance of ostial stenosis.  The patient received 140 mcg/kg of adenosine.  The patient's FFR drops after 1 minute of adenosine to 0.71 suggestive of physiologically significant ostial LAD stenosis.  INTERVENTIONAL PROCEDURE:  Successful PTCA to ostial and proximal LAD was done using 3.0 x 10 mm long AngioSculpt balloon. Multiple inflations were done for predilatation and then 3.5 x 23 mm long Xience Alpine drug- eluting stent was deployed at 11 atmospheric pressure in the ostial and proximal LAD going up to 11 atmospheric pressure.  The stent was post dilated using 3.5 x 15 mm long Asher Trek balloon going up to 18 atmospheric pressure.  Lesion dilated angiographically from 60-65% with strongly positive FFR to 0% residual with excellent results with 0% residual with excellent TIMI grade 3 distal flow without evidence of dissection or distal embolization.  The patient received weight based Angiomax and total of 180 mg of Brilinta prior to the procedure.  The patient tolerated the procedure well.  There were no complications.  The patient was transferred to recovery room in stable condition.     Eduardo OsierMohan N. Sharyn LullHarwani, M.D.     MNH/MEDQ  D:  12/14/2013  T:  12/14/2013  Job:  [id number]

## 2013-12-14 NOTE — Care Management Note (Addendum)
  Page 2 of 2   12/15/2013     11:27:58 AM CARE MANAGEMENT NOTE 12/15/2013  Patient:  [name] Brooks,[name]   Account Number:  [id number]  Date Initiated:  12/14/2013  Documentation initiated by:  Donato SchultzHUTCHINSON,Ajanae Virag  Subjective/Objective Assessment:   unstable angina     Action/Plan:   CM to follow for disposition needs   Anticipated DC Date:  12/14/2013   Anticipated DC Plan:  HOME/SELF CARE      DC Planning Services  CM consult  Other      Choice offered to / List presented to:             Status of service:  Completed, signed off Medicare Important Message given?   (If response is "NO", the following Medicare IM given date fields will be blank) Date Medicare IM given:   Medicare IM given by:   Date Additional Medicare IM given:   Additional Medicare IM given by:    Discharge Disposition:  HOME/SELF CARE  Per UR Regulation:    If discussed at Long Length of Stay Meetings, dates discussed:    Comments:  Jeffery Bachmeier RN, BSN, MSHL, CCM  Nurse - Case Manager,  (Unit [id number])  [phone]  12/15/2013 Patient has Brilinta co-pay assistance card.   Garald Rhew RN, BSN, MSHL, CCM  Nurse - Case Manager,  (Unit [phone])  [phone]  12/14/2013 Med Review:  Brilinta Benefits check: Brilinta:  $84.26 for 30 day supply at retail no auth required  Pharmacy:  [facility] [address], [place], [place] [address] [phone] CM confirmed Brilinta in stock CM provided Brilinta card to patient with instructions.

## 2013-12-14 NOTE — CV Procedure (Signed)
Left cardiac Cath/fractional flow reserve/PTCA stenting report dictated on 12/14/2013 dictation number is [id number]

## 2013-12-14 NOTE — Progress Notes (Signed)
Site: R. Groin Site prior to removal: level 0 Pressure applied for minutes: 20 Minutes beginning at: 1215 Manual- yes Pt status during pull- stable, VS q 5 Post pull groin site- Level 0 Pt tolerated procedure well.  Sheath removed by Katrinka BlazingSMITH, Edilia Ghuman L

## 2013-12-14 NOTE — Progress Notes (Signed)
Subjective:  Doing well denies any chest pain or shortness of breath. Tolerated PCI to LAD this morning.  Objective:  Vital Signs in the last 24 hours: Temp:  [97.7 F (36.5 C)-98 F (36.7 C)] 98 F (36.7 C) (10/06 1600) Pulse Rate:  [57-65] 60 (10/06 1600) Resp:  [18-20] 18 (10/06 1600) BP: (106-131)/(61-87) 106/61 mmHg (10/06 1600) SpO2:  [97 %-100 %] 97 % (10/06 1600) Weight:  [106.142 kg (234 lb)] 106.142 kg (234 lb) (10/06 0541)  Intake/Output from previous day:   Intake/Output from this shift: Total I/O In: -  Out: 800 [Urine:800]  Physical Exam: Neck: no adenopathy, no carotid bruit, no JVD and supple, symmetrical, trachea midline Lungs: clear to auscultation bilaterally Heart: regular rate and rhythm, S1, S2 normal, no murmur, click, rub or gallop Abdomen: soft, non-tender; bowel sounds normal; no masses,  no organomegaly Extremities: extremities normal, atraumatic, no cyanosis or edema and Right groin stable dressing dry no hematoma  Lab Results: No results found for this basename: WBC, HGB, PLT,  in the last 72 hours No results found for this basename: NA, K, CL, CO2, GLUCOSE, BUN, CREATININE,  in the last 72 hours No results found for this basename: TROPONINI, CK, MB,  in the last 72 hours Hepatic Function Panel No results found for this basename: PROT, ALBUMIN, AST, ALT, ALKPHOS, BILITOT, BILIDIR, IBILI,  in the last 72 hours No results found for this basename: CHOL,  in the last 72 hours No results found for this basename: PROTIME,  in the last 72 hours  Imaging: Imaging results have been reviewed and No results found.  Cardiac Studies:  Assessment/Plan:  New-onset angina positive stress Myoview status post left cath/FFR/PCI to LAD with excellent results Hypertension Type 2 diabetes mellitus Hypercholesteremia Positive family history of coronary artery disease Plan Continue present management Check labs in a.m.  LOS: 0 days    [name] Brooks  N 12/14/2013, 5:44 PM

## 2013-12-15 ENCOUNTER — Other Ambulatory Visit: Payer: Self-pay

## 2013-12-15 DIAGNOSIS — I25119 Atherosclerotic heart disease of native coronary artery with unspecified angina pectoris: Secondary | ICD-10-CM | POA: Diagnosis not present

## 2013-12-15 LAB — CBC
HEMATOCRIT: 34.2 % — AB (ref 39.0–52.0)
HEMOGLOBIN: 11 g/dL — AB (ref 13.0–17.0)
MCH: 26.3 pg (ref 26.0–34.0)
MCHC: 32.2 g/dL (ref 30.0–36.0)
MCV: 81.6 fL (ref 78.0–100.0)
Platelets: 256 10*3/uL (ref 150–400)
RBC: 4.19 MIL/uL — ABNORMAL LOW (ref 4.22–5.81)
RDW: 14.1 % (ref 11.5–15.5)
WBC: 4.4 10*3/uL (ref 4.0–10.5)

## 2013-12-15 LAB — BASIC METABOLIC PANEL
Anion gap: 9 (ref 5–15)
BUN: 8 mg/dL (ref 6–23)
CHLORIDE: 105 meq/L (ref 96–112)
CO2: 25 meq/L (ref 19–32)
CREATININE: 0.86 mg/dL (ref 0.50–1.35)
Calcium: 8.8 mg/dL (ref 8.4–10.5)
GFR calc Af Amer: >90 mL/min (ref >90)
GFR calc non Af Amer: >90 mL/min (ref >90)
Glucose, Bld: 95 mg/dL (ref 70–99)
Potassium: 4.1 mEq/L (ref 3.7–5.3)
Sodium: 139 mEq/L (ref 137–147)

## 2013-12-15 LAB — GLUCOSE, CAPILLARY: Glucose-Capillary: 109 mg/dL — ABNORMAL HIGH (ref 70–99)

## 2013-12-15 MED ORDER — ATORVASTATIN CALCIUM 80 MG PO TABS: 80.0000 mg | ORAL_TABLET | Freq: Every day | ORAL | Status: DC

## 2013-12-15 MED ORDER — METFORMIN HCL 1000 MG PO TABS: 1000.0000 mg | ORAL_TABLET | Freq: Two times a day (BID) | ORAL | Status: DC

## 2013-12-15 MED ORDER — NITROGLYCERIN 0.4 MG SL SUBL: 0.4000 mg | SUBLINGUAL_TABLET | SUBLINGUAL | Status: AC | PRN

## 2013-12-15 MED ORDER — FERROUS SULFATE 325 (65 FE) MG PO TABS
325.0000 mg | ORAL_TABLET | Freq: Three times a day (TID) | ORAL | Status: DC
Start: 2013-12-15 — End: 2013-12-15
  Filled 2013-12-15 (×2): qty 1

## 2013-12-15 MED ORDER — METOPROLOL SUCCINATE ER 25 MG PO TB24: 25.0000 mg | ORAL_TABLET | Freq: Every day | ORAL | Status: AC

## 2013-12-15 MED ORDER — FERROUS SULFATE 325 (65 FE) MG PO TABS: 325.0000 mg | ORAL_TABLET | Freq: Three times a day (TID) | ORAL | Status: DC

## 2013-12-15 MED ORDER — LISINOPRIL 10 MG PO TABS: 10.0000 mg | ORAL_TABLET | Freq: Every day | ORAL | Status: DC

## 2013-12-15 MED FILL — Sodium Chloride IV Soln 0.9%: INTRAVENOUS | Qty: 50 | Status: AC

## 2013-12-15 NOTE — Discharge Instructions (Signed)
Angina Pectoris Angina pectoris is extreme discomfort in your chest, neck, or arm. Your doctor may call it just angina. It is caused by a lack of oxygen to your heart wall. It may feel like tightness or heavy pressure. It may feel like a crushing or squeezing pain. Some people say it feels like gas. It may go down your shoulders, back, and arms. Some people have symptoms other than pain. These include:  Tiredness.  Shortness of breath.  Cold sweats.  Feeling sick to your stomach (nausea). There are four types of angina:  Stable angina. This type often lasts the same amount of time each time it happens. Activity, stress, or excitement can bring it on. It often gets better after taking a medicine called nitroglycerin. This goes under your tongue.  Unstable angina. This type can happen when you are not active or even during sleep. It can suddenly get worse or happen more often. It may not get better after taking the special medicine. It can last up to 30 minutes.  Microvascular angina. This type is more common in women. It may be more severe or last longer than other types.  Prinzmetal angina. This type often happens when you are not active or in the early morning hours. HOME CARE   Only take medicines as told by your doctor.  Stay active or exercise more as told by your doctor.  Limit very hard activity as told by your doctor.  Limit heavy lifting as told by your doctor.  Keep a healthy weight.  Learn about and eat foods that are healthy for your heart.  Do not use any tobacco such as cigarettes, chewing tobacco, or e-cigarettes. GET HELP RIGHT AWAY IF:   You have chest, neck, deep shoulder, or arm pain or discomfort that lasts more than a few minutes.  You have chest, neck, deep shoulder, or arm pain or discomfort that goes away and comes back over and over again.  You have heavy sweating that seems to happen for no reason.  You have shortness of breath or trouble  breathing.  Your angina does not get better after a few minutes of rest.  Your angina does not get better after you take nitroglycerin medicine. These can all be symptoms of a heart attack. Get help right away. Call your local emergency service (911 in [place] Do not  drive yourself to the hospital. Do not  wait to for your symptoms to go away. MAKE SURE YOU:   Understand these instructions.  Will watch your condition.  Will get help right away if you are not doing well or get worse. Document Released: 08/14/2007 Document Revised: 03/02/2013 Document Reviewed: 06/29/2013 [facility] Patient Information 2015 [place], [place]. This information is not intended to replace advice given to you by your health care provider. Make sure you discuss any questions you have with your health care provider. Coronary Angiogram with Stent Coronary angiography with stent placement is a procedure to widen or open a narrow blood vessel of the heart (coronary artery). When a coronary artery becomes partially blocked, it decreases blood flow to that area. This may lead to chest pain or a heart attack (myocardial infarction). Arteries may become blocked by cholesterol buildup (plaque) in the lining or wall.  A stent is a small piece of metal that looks like a mesh or a spring. Stent placement may be done right after a coronary angiography in which a blocked artery is found or as a treatment for a heart attack.  LET [facility]OUR HEALTH CARE PROVIDER KNOW ABOUT:  Any allergies you have.   All medicines you are taking, including vitamins, herbs, eye drops, creams, and over-the-counter medicines.   Previous problems you or members of your family have had with the use of anesthetics.   Any blood disorders you have.   Previous surgeries you have had.   Medical conditions you have. RISKS AND COMPLICATIONS Generally, coronary angiography with stent is a safe procedure. However, problems can occur and include:  Damage to  the heart or its blood vessels.   A return of blockage.   Bleeding, infection, or bruising at the insertion site.   A collection of blood under the skin (hematoma) at the insertion site.  Blood clot in another part of the body.   Kidney injury.   Allergic reaction to the dye or contrast used.   Bleeding into the abdomen (retroperitoneal bleeding). BEFORE THE PROCEDURE  Do not eat or drink anything after midnight on the night before the procedure or as directed by your health care provider.  Ask your health care provider about changing or stopping your regular medicines. This is especially important if you are taking diabetes medicines or blood thinners.  Your health care provider will make sure you understand the procedure as well as the risks and potential problems associated with the procedure.  PROCEDURE  You may be given a medicine to help you relax before and during the procedure (sedative). This medicine will be given through an IV tube that is put into one of your veins.   The area where the catheter will be inserted will be shaved and cleaned. This is usually done in the groin but may be done in the fold of your arm (near your elbow) or in the wrist.   A medicine will be given to numb the area where the catheter will be inserted (local anesthetic).   The catheter will be inserted into an artery using a guide wire. A type of X-ray (fluoroscopy) will be used to help guide the catheter to the opening of the blocked artery.   A dye will then be injected into the catheter, and X-rays will be taken. The dye will help to show where any narrowing or blockages are located in the heart arteries.   A tiny wire will be guided to the blocked spot, and a balloon will be inflated to make the artery wider. The stent will be expanded and will crush the plaque into the wall of the vessel. The stent will hold the area open like a scaffolding and improve the blood flow.    Sometimes the artery may be made wider using a laser or other tools to remove plaque.   When the blood flow is better, the catheter will be removed. The lining of the artery will grow over the stent, which stays where it was placed.  AFTER THE PROCEDURE  If the procedure is done through the leg, you will be kept in bed lying flat for about 6 hours. You will be instructed to not bend or cross your legs.   The insertion site will be checked frequently.   The pulse in your feet or wrist will be checked frequently.   Additional blood tests, X-rays, and electrocardiography may be done. Document Released: 09/01/2002 Document Revised: 07/12/2013 Document Reviewed: 09/03/2012 [facility] Patient Information 2015 [place], [place]. This information is not intended to replace advice given to you by your health care provider. Make sure you discuss any questions you  have with your health care provider. ° °
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
## 2013-12-15 NOTE — Progress Notes (Signed)
CARDIAC REHAB PHASE I   PRE:  Rate/Rhythm: 74 SR  BP:  Supine: 124/69  Sitting:   Standing:    SaO2:   MODE:  Ambulation: 800 ft   POST:  Rate/Rhythm: 85 SR  BP:  Supine: 143/71  Sitting:   Standing:    SaO2:  0758-0900 Pt walked 800 ft with steady gait. No CP. Tolerated well. Education completed with pt and wife who voiced understanding. Stated he has brilinta packet. Stressed importance of taking twice a day. Has NTG and reviewed use. Pt has been to diabetic classes but has not been following a diabetic diet. Re enforced carb counting and heart healthy diet. Discussed CRP 2 and permission given to refer to [place] program.   Luetta Nuttingharlene Pryor Guettler, RN BSN  12/15/2013 8:57 AM

## 2013-12-15 NOTE — Discharge Summary (Signed)
  Discharge summary dictated on 12/15/2013 dictation number is [phone]

## 2013-12-16 LAB — GLUCOSE, CAPILLARY: Glucose-Capillary: 127 mg/dL — ABNORMAL HIGH (ref 70–99)

## 2013-12-17 ENCOUNTER — Encounter (HOSPITAL_COMMUNITY): Payer: Managed Care, Other (non HMO)

## 2014-01-06 ENCOUNTER — Telehealth (HOSPITAL_COMMUNITY): Payer: Self-pay | Admitting: *Deleted

## 2014-01-06 NOTE — Telephone Encounter (Signed)
Checking back with pt to see if he had contacted the insuarnce for benefit for cardiac rehab. Contact information left. Alanson Alyarlette Carlton RN, BSN

## 2014-01-21 ENCOUNTER — Emergency Department (HOSPITAL_COMMUNITY): Payer: Managed Care, Other (non HMO)

## 2014-01-21 ENCOUNTER — Encounter (HOSPITAL_COMMUNITY): Payer: Self-pay | Admitting: Family Medicine

## 2014-01-21 ENCOUNTER — Emergency Department (HOSPITAL_COMMUNITY)
Admission: EM | Admit: 2014-01-21 | Discharge: 2014-01-21 | Disposition: A | Discharge status: Home / Self Care | Payer: Managed Care, Other (non HMO) | Attending: Emergency Medicine | Admitting: Emergency Medicine

## 2014-01-21 DIAGNOSIS — I2510 Atherosclerotic heart disease of native coronary artery without angina pectoris: Secondary | ICD-10-CM | POA: Diagnosis not present

## 2014-01-21 DIAGNOSIS — R042 Hemoptysis: Secondary | ICD-10-CM | POA: Insufficient documentation

## 2014-01-21 DIAGNOSIS — Z9861 Coronary angioplasty status: Secondary | ICD-10-CM | POA: Insufficient documentation

## 2014-01-21 DIAGNOSIS — E119 Type 2 diabetes mellitus without complications: Secondary | ICD-10-CM | POA: Diagnosis not present

## 2014-01-21 DIAGNOSIS — Z9889 Other specified postprocedural states: Secondary | ICD-10-CM | POA: Insufficient documentation

## 2014-01-21 DIAGNOSIS — R61 Generalized hyperhidrosis: Secondary | ICD-10-CM | POA: Diagnosis not present

## 2014-01-21 DIAGNOSIS — R05 Cough: Secondary | ICD-10-CM

## 2014-01-21 DIAGNOSIS — Z7982 Long term (current) use of aspirin: Secondary | ICD-10-CM | POA: Diagnosis not present

## 2014-01-21 DIAGNOSIS — R059 Cough, unspecified: Secondary | ICD-10-CM

## 2014-01-21 DIAGNOSIS — R197 Diarrhea, unspecified: Secondary | ICD-10-CM | POA: Diagnosis not present

## 2014-01-21 DIAGNOSIS — I10 Essential (primary) hypertension: Secondary | ICD-10-CM | POA: Insufficient documentation

## 2014-01-21 DIAGNOSIS — R6883 Chills (without fever): Secondary | ICD-10-CM | POA: Insufficient documentation

## 2014-01-21 DIAGNOSIS — Z79899 Other long term (current) drug therapy: Secondary | ICD-10-CM | POA: Diagnosis not present

## 2014-01-21 DIAGNOSIS — R040 Epistaxis: Secondary | ICD-10-CM | POA: Diagnosis not present

## 2014-01-21 LAB — COMPREHENSIVE METABOLIC PANEL
ALT: 14 U/L (ref 0–53)
ANION GAP: 16 — AB (ref 5–15)
AST: 15 U/L (ref 0–37)
Albumin: 3.6 g/dL (ref 3.5–5.2)
Alkaline Phosphatase: 93 U/L (ref 39–117)
BUN: 10 mg/dL (ref 6–23)
CALCIUM: 9.4 mg/dL (ref 8.4–10.5)
CO2: 23 mEq/L (ref 19–32)
Chloride: 102 mEq/L (ref 96–112)
Creatinine, Ser: 0.84 mg/dL (ref 0.50–1.35)
GFR calc non Af Amer: >90 mL/min (ref >90)
GLUCOSE: 183 mg/dL — AB (ref 70–99)
Potassium: 3.8 mEq/L (ref 3.7–5.3)
Sodium: 141 mEq/L (ref 137–147)
Total Bilirubin: 0.4 mg/dL (ref 0.3–1.2)
Total Protein: 6.9 g/dL (ref 6.0–8.3)

## 2014-01-21 LAB — CBC WITH DIFFERENTIAL/PLATELET
Basophils Absolute: 0 10*3/uL (ref 0.0–0.1)
Basophils Relative: 0 % (ref 0–1)
EOS ABS: 0.2 10*3/uL (ref 0.0–0.7)
Eosinophils Relative: 3 % (ref 0–5)
HEMATOCRIT: 36.5 % — AB (ref 39.0–52.0)
HEMOGLOBIN: 11.6 g/dL — AB (ref 13.0–17.0)
LYMPHS ABS: 0.8 10*3/uL (ref 0.7–4.0)
Lymphocytes Relative: 11 % — ABNORMAL LOW (ref 12–46)
MCH: 26.4 pg (ref 26.0–34.0)
MCHC: 31.8 g/dL (ref 30.0–36.0)
MCV: 83.1 fL (ref 78.0–100.0)
MONO ABS: 0.5 10*3/uL (ref 0.1–1.0)
MONOS PCT: 7 % (ref 3–12)
NEUTROS PCT: 79 % — AB (ref 43–77)
Neutro Abs: 5.4 10*3/uL (ref 1.7–7.7)
Platelets: 308 10*3/uL (ref 150–400)
RBC: 4.39 MIL/uL (ref 4.22–5.81)
RDW: 13.7 % (ref 11.5–15.5)
WBC: 6.9 10*3/uL (ref 4.0–10.5)

## 2014-01-21 LAB — PROTIME-INR
INR: 1.08 (ref 0.00–1.49)
Prothrombin Time: 14.1 seconds (ref 11.6–15.2)

## 2014-01-21 IMAGING — CR DG CHEST 2V
2 series · 2 of 2 positions shown · non-contrast
Comparison: 11/25/2013

CLINICAL DATA: Bloody cough this morning, history hypertension,
diabetes, coronary artery disease

EXAM:
CHEST  2 VIEW

[w chest pa]
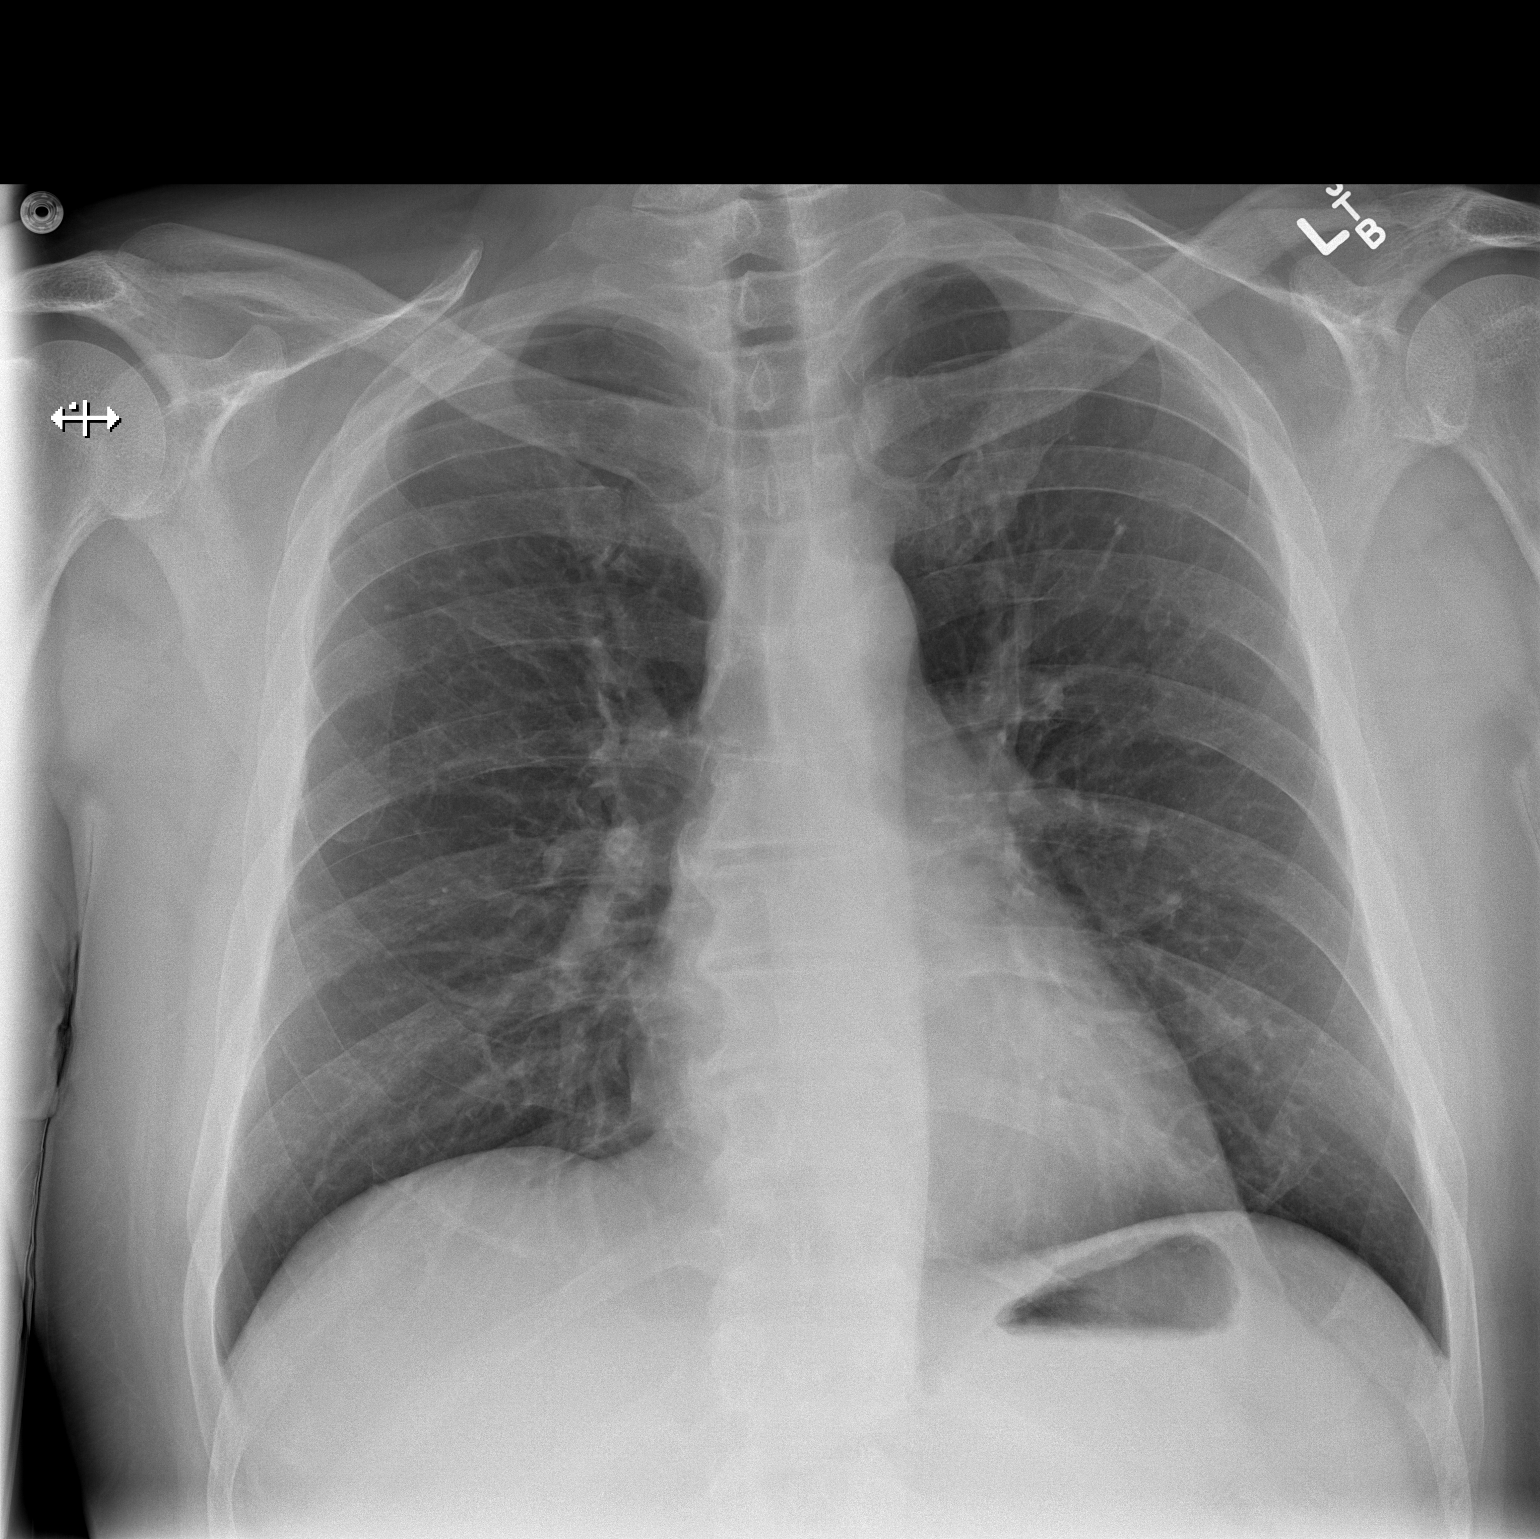

[w chest lat]
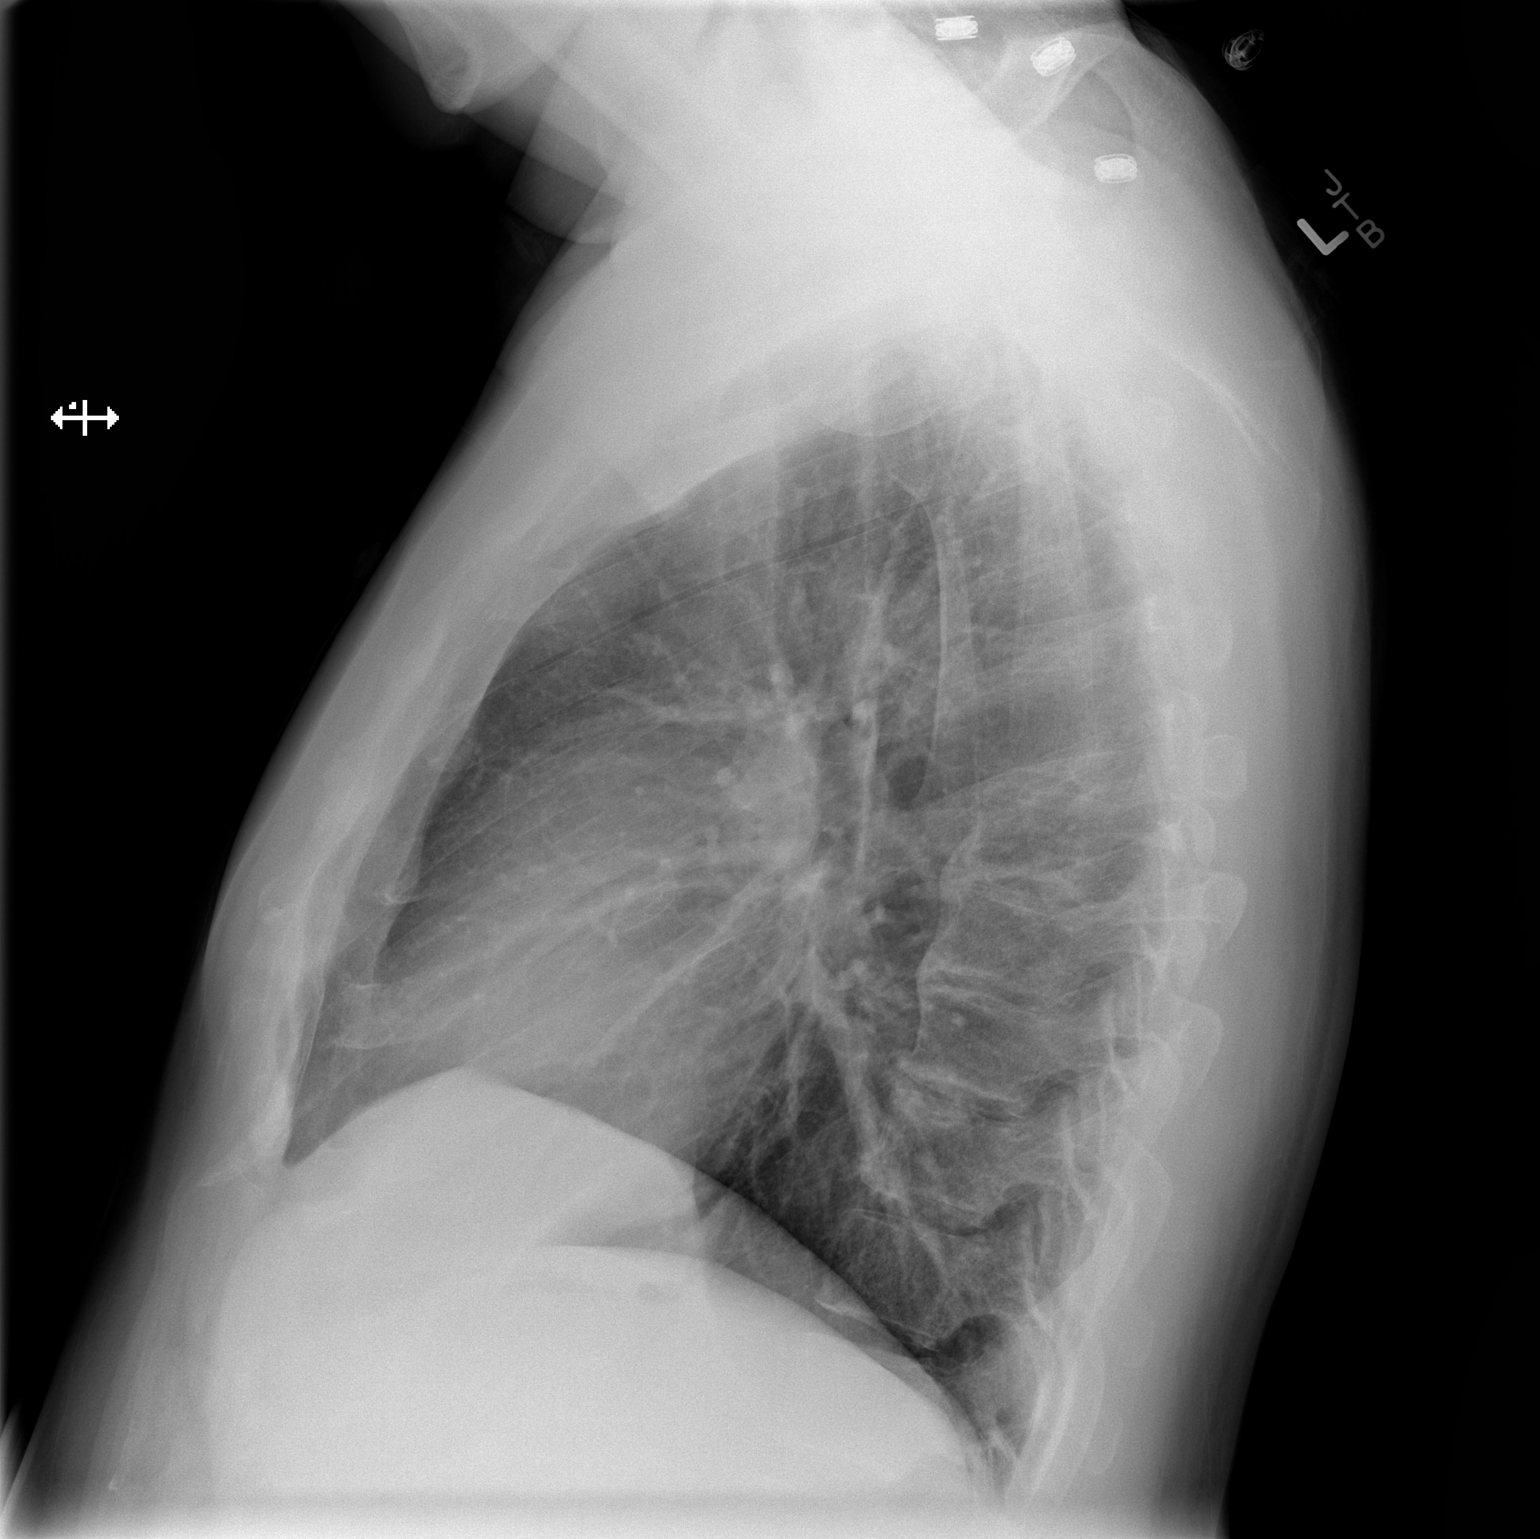

[2 of 2 positions shown; findings below may reference images not displayed]

FINDINGS: Normal heart size, mediastinal contours and pulmonary vascularity.

Lungs clear.

No pleural effusion or pneumothorax.

Minimal central peribronchial thickening noted.

Bones demineralized.
IMPRESSION: Minimal bronchitic changes.

No acute abnormalities.

## 2014-01-21 IMAGING — CT CT ANGIO CHEST
1 of 9 series · 13 of 36 positions shown · IV contrast (omnipaque)
Comparison: Chest x-ray obtained earlier today 01/21/2014; prior
chest CT 11/25/2013

CLINICAL DATA: 58-year-old male with cough, hemoptysis and recent
nose bleeds.

EXAM:
CT ANGIOGRAPHY CHEST WITH CONTRAST
TECHNIQUE: Multidetector CT imaging of the chest was performed using the
standard protocol during bolus administration of intravenous
contrast. Multiplanar CT image reconstructions and MIPs were
obtained to evaluate the vascular anatomy.
CONTRAST:  75mL OMNIPAQUE IOHEXOL 350 MG/ML SOLN

[Series 407: thins pacs · axial · 0.66mm/px · z∈[+123,+355]mm · 13 of 272 slices shown]
[im 20/272  lung]
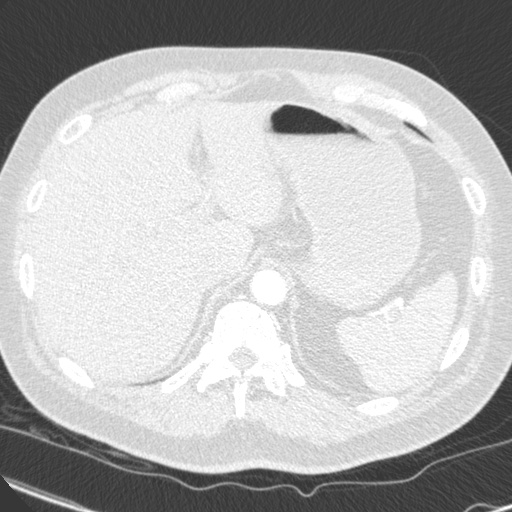
[im 39/272  mediastinal]
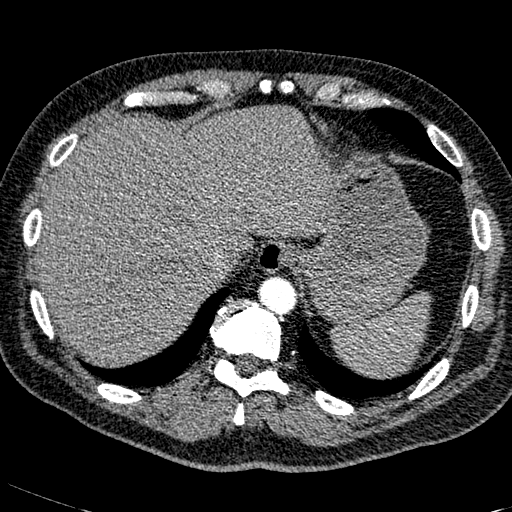
[im 59/272  lung]
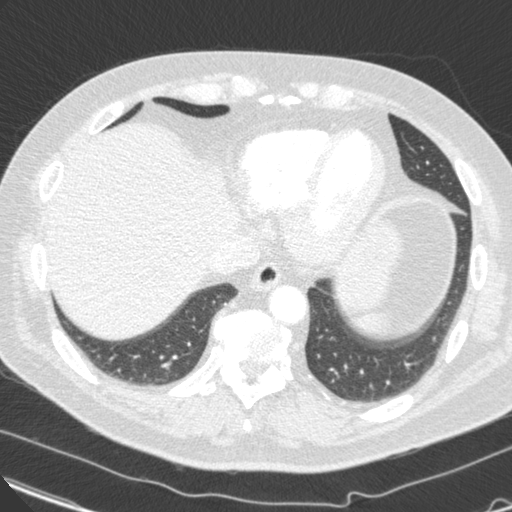
[im 78/272  mediastinal]
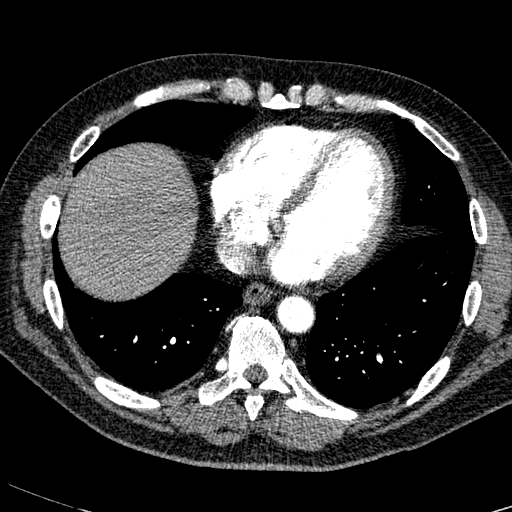
[im 97/272  lung]
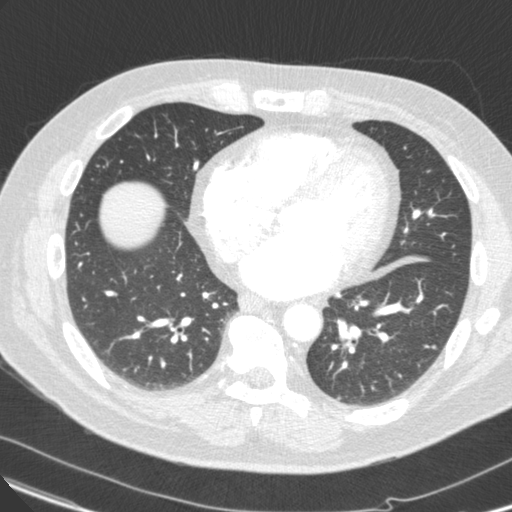
[im 117/272  mediastinal]
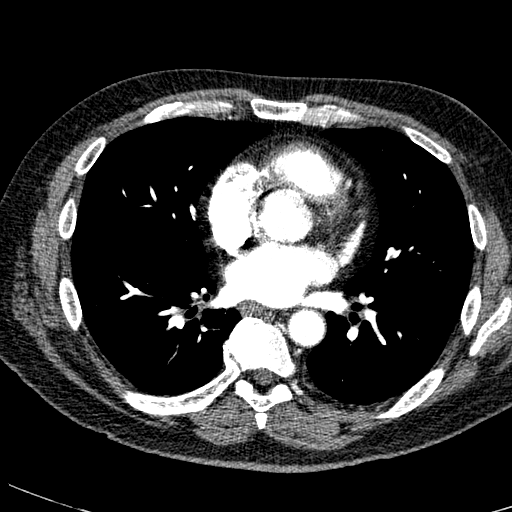
[im 136/272  lung]
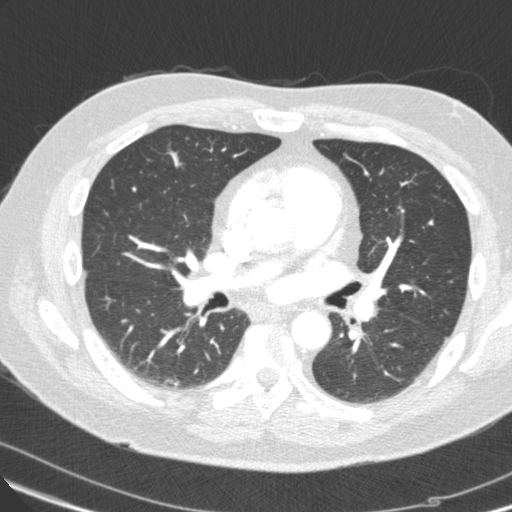
[im 155/272  mediastinal]
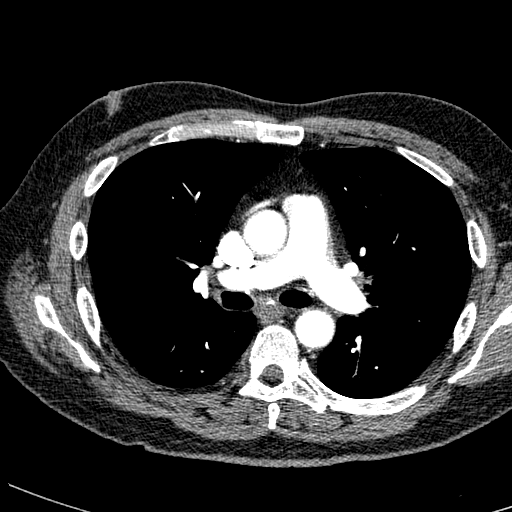
[im 175/272  lung]
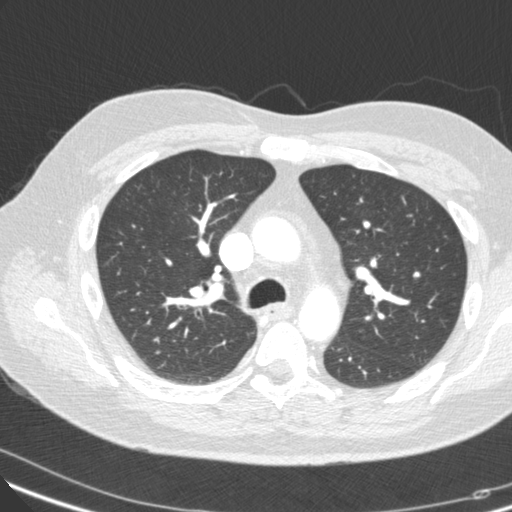
[im 194/272  mediastinal]
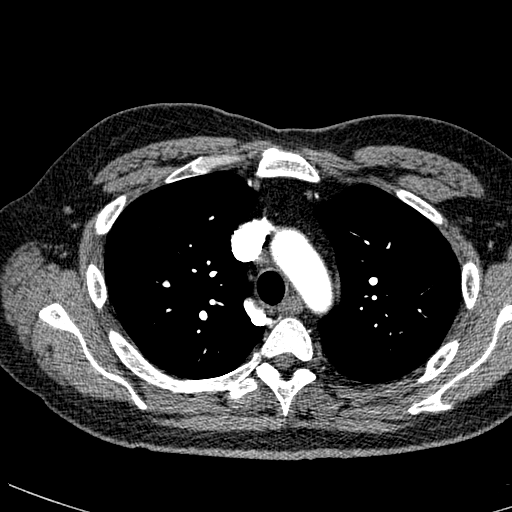
[im 213/272  lung]
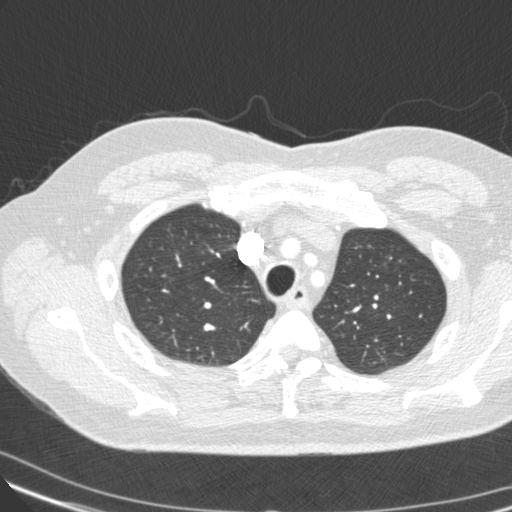
[im 233/272  mediastinal]
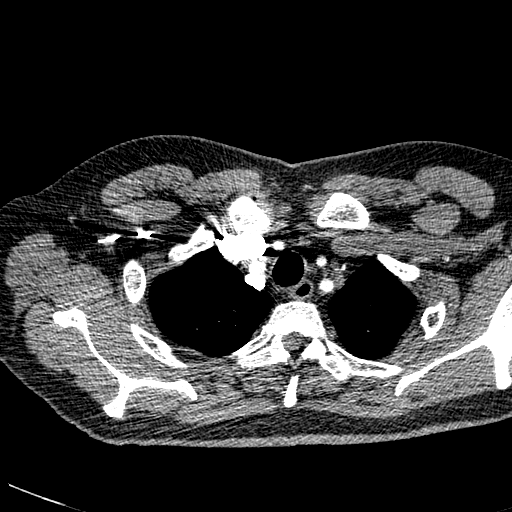
[im 252/272  lung]
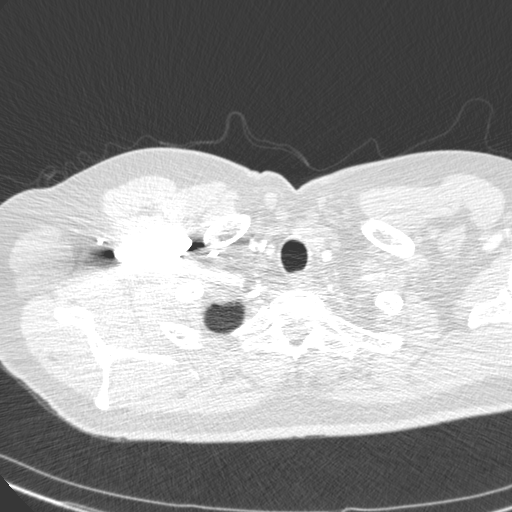

[13 of 36 positions shown; findings below may reference images not displayed]

FINDINGS: Mediastinum: Unremarkable CT appearance of the thyroid gland. No
suspicious mediastinal or hilar adenopathy. No soft tissue
mediastinal mass. The thoracic esophagus is unremarkable.

Heart/Vascular: Adequate opacification of the pulmonary arteries to
the proximal subsegmental level. No evidence of central filling
defect to suggest acute pulmonary embolus. The heart is within
normal limits for size. No pericardial effusion. Atherosclerotic
calcifications present in the coronary arteries. There is a stent in
the left main coronary artery extending into the LAD. Normal caliber
aorta. Two vessel arch. The left common carotid and brachiocephalic
arteries share a common origin.

Lungs/Pleura: No pleural effusion. The lungs are clear. Stable small
subpleural pulmonary nodules measuring up to 5 mm in size (image 47
series 406).

Bones/Soft Tissues: No acute fracture or aggressive appearing lytic
or blastic osseous lesion.

Upper Abdomen: Visualized upper abdominal organs are unremarkable.

Review of the MIP images confirms the above findings.
IMPRESSION: 1. Negative for acute pulmonary embolus, pneumonia or other acute
cardiopulmonary abnormality.
2. 2 month stability of small bilateral subpleural pulmonary nodules
the largest of which measures 5 mm.

## 2014-01-21 MED ORDER — AMOXICILLIN-POT CLAVULANATE 875-125 MG PO TABS: 1.0000 | ORAL_TABLET | Freq: Two times a day (BID) | ORAL | Status: DC

## 2014-01-21 MED ORDER — IOHEXOL 350 MG/ML SOLN
75.0000 mL | Freq: Once | INTRAVENOUS | Status: AC | PRN
  Administered 2014-01-21: 75 mL via INTRAVENOUS

## 2014-01-21 MED ORDER — CLOPIDOGREL BISULFATE 75 MG PO TABS: 75.0000 mg | ORAL_TABLET | Freq: Every day | ORAL | Status: AC

## 2014-01-21 MED ORDER — SODIUM CHLORIDE 0.9 % IV BOLUS (SEPSIS)
500.0000 mL | Freq: Once | INTRAVENOUS | Status: AC
  Administered 2014-01-21: 500 mL via INTRAVENOUS

## 2014-01-21 NOTE — ED Notes (Signed)
Patient has not returned from CT upon arrival to the floor.

## 2014-01-21 NOTE — Discharge Instructions (Signed)
Metformin and X-ray Contrast Studies For some X-ray exams, a contrast dye is used. Contrast dye is a type of medicine used to make the X-ray image clearer. The contrast dye is given to the patient through a vein (intravenously). If you need to have this type of X-ray exam and you take a medication called metformin, your caregiver may have you stop taking metformin before the exam.  LACTIC ACIDOSIS In rare cases, a serious medical condition called lactic acidosis can develop in people who take metformin and receive contrast dye. The following conditions can increase the risk of this complication:   Kidney failure.  Liver problems.  Certain types of heart problems such as:  Heart failure.  Heart attack.  Heart infection.  Heart valve problems.  Alcohol abuse. If left untreated, lactic acidosis can lead to coma.  SYMPTOMS OF LACTIC ACIDOSIS Symptoms of lactic acidosis can include:  Rapid breathing (hyperventilation).  Neurologic symptoms such as:  Headaches.  Confusion.  Dizziness.  Excessive sweating.  Feeling sick to your stomach (nauseous) or throwing up (vomiting). AFTER THE X-RAY EXAM  Stay well-hydrated. Drink fluids as instructed by your caregiver.  If you have a risk of developing lactic acidosis, blood tests may be done to make sure your kidney function is okay.  Metformin is usually stopped for 48 hours after the X-ray exam. Ask your caregiver when you can start taking metformin again. SEEK MEDICAL CARE IF:   You have shortness of breath or difficulty breathing.  You develop a headache that does not go away.  You have nausea or vomiting.  You urinate more than normal.  You develop a skin rash and have:  Redness.  Swelling.  Itching. Document Released: 02/13/2009 Document Revised: 05/20/2011 Document Reviewed: 02/13/2009 [facility] Patient Information 2015 [place], [place]. This information is not intended to replace advice given to you by your health  care provider. Make sure you discuss any questions you have with your health care provider.   Hemoptysis Hemoptysis, which means coughing up blood, can be a sign of a minor problem or a serious medical condition. The blood that is coughed up may come from the lungs and airways. Coughed-up blood can also come from bleeding that occurs outside the lungs and airways. Blood can drain into the windpipe during a severe nosebleed or when blood is vomited from the stomach. Because hemoptysis can be a sign of something serious, a medical evaluation is required. For some people with hemoptysis, no definite cause is ever identified. CAUSES  The most common cause of hemoptysis is bronchitis. Some other common causes include:   A ruptured blood vessel caused by coughing or an infection.   A medical condition that causes damage to the large air passageways (bronchiectasis).   A blood clot in the lungs (pulmonary embolism).   Pneumonia.   Tuberculosis.   Breathing in a small foreign object.   Cancer. For some people with hemoptysis, no definite cause is ever identified.  HOME CARE INSTRUCTIONS  Only take over-the-counter or prescription medicines as directed by your caregiver. Do not use cough suppressants unless your caregiver approves.  If your caregiver prescribes antibiotic medicines, take them as directed. Finish them even if you start to feel better.  Do not smoke. Also avoid secondhand smoke.  Follow up with your caregiver as directed. SEEK IMMEDIATE MEDICAL CARE IF:   You cough up bloody mucus for longer than a week.  You have a blood-producing cough that is severe or getting worse.  You have  a blood-producing cough thatcomes and goes over time.  You develop problems with your breathing.   You vomit blood.  You develop bloody or black-colored stools.  You have chest pain.   You develop night sweats.  You feel faint or pass out.   You have a fever or persistent  symptoms for more than 2-3 days.  You have a fever and your symptoms suddenly get worse. MAKE SURE YOU:  Understand these instructions.  Will watch your condition.  Will get help right away if you are not doing well or get worse. Document Released: 05/06/2001 Document Revised: 02/12/2012 Document Reviewed: 12/13/2011 [facility] Patient Information 2015 [place], [place]. This information is not intended to replace advice given to you by your health care provider. Make sure you discuss any questions you have with your health care provider.   Nosebleed Nosebleeds can be caused by many conditions, including trauma, infections, polyps, foreign bodies, dry mucous membranes or climate, medicines, and air conditioning. Most nosebleeds occur in the front of the nose. Because of this location, most nosebleeds can be controlled by pinching the nostrils gently and continuously for at least 10 to 20 minutes. The long, continuous pressure allows enough time for the blood to clot. If pressure is released during that 10 to 20 minute time period, the process may have to be started again. The nosebleed may stop by itself or quit with pressure, or it may need concentrated heating (cautery) or pressure from packing. HOME CARE INSTRUCTIONS   If your nose was packed, try to maintain the pack inside until your health care provider removes it. If a gauze pack was used and it starts to fall out, gently replace it or cut the end off. Do not cut if a balloon catheter was used to pack the nose. Otherwise, do not remove unless instructed.  Avoid blowing your nose for 12 hours after treatment. This could dislodge the pack or clot and start the bleeding again.  If the bleeding starts again, sit up and bend forward, gently pinching the front half of your nose continuously for 20 minutes.  If bleeding was caused by dry mucous membranes, use over-the-counter saline nasal spray or gel. This will keep the mucous membranes moist  and allow them to heal. If you must use a lubricant, choose the water-soluble variety. Use it only sparingly and not within several hours of lying down.  Do not use petroleum jelly or mineral oil, as these may drip into the lungs and cause serious problems.  Maintain humidity in your home by using less air conditioning or by using a humidifier.  Do not use aspirin or medicines which make bleeding more likely. Your health care provider can give you recommendations on this.  Resume normal activities as you are able, but try to avoid straining, lifting, or bending at the waist for several days.  If the nosebleeds become recurrent and the cause is unknown, your health care provider may suggest laboratory tests. SEEK MEDICAL CARE IF: You have a fever. SEEK IMMEDIATE MEDICAL CARE IF:   Bleeding recurs and cannot be controlled.  There is unusual bleeding from or bruising on other parts of the body.  Nosebleeds continue.  There is any worsening of the condition which originally brought you in.  You become light-headed, feel faint, become sweaty, or vomit blood. MAKE SURE YOU:   Understand these instructions.  Will watch your condition.  Will get help right away if you are not doing well or get worse. Document  Released: 12/05/2004 Document Revised: 07/12/2013 Document Reviewed: 01/26/2009 [facility] Patient Information 2015 [place], [place]. This information is not intended to replace advice given to you by your health care provider. Make sure you discuss any questions you have with your health care provider.

## 2014-01-21 NOTE — ED Notes (Signed)
Care Handoff given to Serita KyleAshura, RN
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
## 2014-01-21 NOTE — ED Notes (Signed)
MD Walden at the bedside. 

## 2014-01-21 NOTE — ED Notes (Signed)
Pt presents from home via POV with c/o epistaxis and hematemesis that began this morning while at work. PT has been on a blood thinner for approx 65month and has no history of these symptoms before today. Small amount of blood dripping from nose intermittently. Pt is A&Ox4 and in NAD.

## 2014-01-21 NOTE — ED Notes (Signed)
MD at bedside. 

## 2014-01-31 ENCOUNTER — Ambulatory Visit (HOSPITAL_COMMUNITY): Payer: Managed Care, Other (non HMO)

## 2014-02-01 ENCOUNTER — Ambulatory Visit (HOSPITAL_COMMUNITY): Payer: Managed Care, Other (non HMO)

## 2014-02-07 ENCOUNTER — Ambulatory Visit (HOSPITAL_COMMUNITY): Payer: Managed Care, Other (non HMO)

## 2014-02-09 ENCOUNTER — Ambulatory Visit (HOSPITAL_COMMUNITY): Payer: Managed Care, Other (non HMO)

## 2014-02-11 ENCOUNTER — Ambulatory Visit (HOSPITAL_COMMUNITY): Payer: Managed Care, Other (non HMO)

## 2014-02-14 ENCOUNTER — Ambulatory Visit (HOSPITAL_COMMUNITY): Payer: Managed Care, Other (non HMO)

## 2014-02-16 ENCOUNTER — Ambulatory Visit (HOSPITAL_COMMUNITY): Payer: Managed Care, Other (non HMO)

## 2014-02-17 ENCOUNTER — Encounter (HOSPITAL_COMMUNITY): Payer: Self-pay | Admitting: Cardiology

## 2014-02-18 ENCOUNTER — Ambulatory Visit (HOSPITAL_COMMUNITY): Payer: Managed Care, Other (non HMO)

## 2014-02-21 ENCOUNTER — Ambulatory Visit (HOSPITAL_COMMUNITY): Payer: Managed Care, Other (non HMO)

## 2014-02-23 ENCOUNTER — Ambulatory Visit (HOSPITAL_COMMUNITY): Payer: Managed Care, Other (non HMO)

## 2014-02-25 ENCOUNTER — Ambulatory Visit (HOSPITAL_COMMUNITY): Payer: Managed Care, Other (non HMO)

## 2014-02-28 ENCOUNTER — Ambulatory Visit (HOSPITAL_COMMUNITY): Payer: Managed Care, Other (non HMO)

## 2014-03-02 ENCOUNTER — Ambulatory Visit (HOSPITAL_COMMUNITY): Payer: Managed Care, Other (non HMO)

## 2014-03-07 ENCOUNTER — Ambulatory Visit (HOSPITAL_COMMUNITY): Payer: Managed Care, Other (non HMO)

## 2014-03-09 ENCOUNTER — Ambulatory Visit (HOSPITAL_COMMUNITY): Payer: Managed Care, Other (non HMO)

## 2014-03-14 ENCOUNTER — Ambulatory Visit (HOSPITAL_COMMUNITY): Payer: Managed Care, Other (non HMO)

## 2014-03-16 ENCOUNTER — Ambulatory Visit (HOSPITAL_COMMUNITY): Payer: Managed Care, Other (non HMO)

## 2014-03-18 ENCOUNTER — Ambulatory Visit (HOSPITAL_COMMUNITY): Payer: Managed Care, Other (non HMO)

## 2014-03-21 ENCOUNTER — Ambulatory Visit (HOSPITAL_COMMUNITY): Payer: Managed Care, Other (non HMO)

## 2014-03-23 ENCOUNTER — Ambulatory Visit (HOSPITAL_COMMUNITY): Payer: Managed Care, Other (non HMO)

## 2014-03-25 ENCOUNTER — Ambulatory Visit (HOSPITAL_COMMUNITY): Payer: Managed Care, Other (non HMO)

## 2014-03-28 ENCOUNTER — Ambulatory Visit (HOSPITAL_COMMUNITY): Payer: Managed Care, Other (non HMO)

## 2014-03-30 ENCOUNTER — Ambulatory Visit (HOSPITAL_COMMUNITY): Payer: Managed Care, Other (non HMO)

## 2014-04-01 ENCOUNTER — Ambulatory Visit (HOSPITAL_COMMUNITY): Payer: Managed Care, Other (non HMO)

## 2014-04-04 ENCOUNTER — Ambulatory Visit (HOSPITAL_COMMUNITY): Payer: Managed Care, Other (non HMO)

## 2014-04-06 ENCOUNTER — Ambulatory Visit (HOSPITAL_COMMUNITY): Payer: Managed Care, Other (non HMO)

## 2014-04-08 ENCOUNTER — Ambulatory Visit (HOSPITAL_COMMUNITY): Payer: Managed Care, Other (non HMO)

## 2014-04-11 ENCOUNTER — Ambulatory Visit (HOSPITAL_COMMUNITY): Payer: Managed Care, Other (non HMO)

## 2014-04-13 ENCOUNTER — Ambulatory Visit (HOSPITAL_COMMUNITY): Payer: Managed Care, Other (non HMO)

## 2014-04-15 ENCOUNTER — Ambulatory Visit (HOSPITAL_COMMUNITY): Payer: Managed Care, Other (non HMO)

## 2014-04-18 ENCOUNTER — Ambulatory Visit (HOSPITAL_COMMUNITY): Payer: Managed Care, Other (non HMO)

## 2014-04-20 ENCOUNTER — Ambulatory Visit (HOSPITAL_COMMUNITY): Payer: Managed Care, Other (non HMO)

## 2014-04-22 ENCOUNTER — Ambulatory Visit (HOSPITAL_COMMUNITY): Payer: Managed Care, Other (non HMO)

## 2014-04-25 ENCOUNTER — Ambulatory Visit (HOSPITAL_COMMUNITY): Payer: Managed Care, Other (non HMO)

## 2014-04-27 ENCOUNTER — Ambulatory Visit (HOSPITAL_COMMUNITY): Payer: Managed Care, Other (non HMO)

## 2014-04-29 ENCOUNTER — Ambulatory Visit (HOSPITAL_COMMUNITY): Payer: Managed Care, Other (non HMO)

## 2014-05-02 ENCOUNTER — Ambulatory Visit (HOSPITAL_COMMUNITY): Payer: Managed Care, Other (non HMO)

## 2014-05-03 ENCOUNTER — Encounter (HOSPITAL_COMMUNITY): Payer: Self-pay | Admitting: Emergency Medicine

## 2014-05-03 ENCOUNTER — Emergency Department (HOSPITAL_COMMUNITY)
Admission: EM | Admit: 2014-05-03 | Discharge: 2014-05-04 | Disposition: A | Discharge status: Home / Self Care | Payer: Managed Care, Other (non HMO) | Attending: Emergency Medicine | Admitting: Emergency Medicine

## 2014-05-03 DIAGNOSIS — R319 Hematuria, unspecified: Secondary | ICD-10-CM

## 2014-05-03 DIAGNOSIS — Z79899 Other long term (current) drug therapy: Secondary | ICD-10-CM | POA: Insufficient documentation

## 2014-05-03 DIAGNOSIS — I2510 Atherosclerotic heart disease of native coronary artery without angina pectoris: Secondary | ICD-10-CM | POA: Insufficient documentation

## 2014-05-03 DIAGNOSIS — I10 Essential (primary) hypertension: Secondary | ICD-10-CM | POA: Insufficient documentation

## 2014-05-03 DIAGNOSIS — Z7982 Long term (current) use of aspirin: Secondary | ICD-10-CM | POA: Insufficient documentation

## 2014-05-03 DIAGNOSIS — Z9889 Other specified postprocedural states: Secondary | ICD-10-CM | POA: Diagnosis not present

## 2014-05-03 DIAGNOSIS — E119 Type 2 diabetes mellitus without complications: Secondary | ICD-10-CM | POA: Diagnosis not present

## 2014-05-03 DIAGNOSIS — N200 Calculus of kidney: Secondary | ICD-10-CM | POA: Insufficient documentation

## 2014-05-03 DIAGNOSIS — N3001 Acute cystitis with hematuria: Secondary | ICD-10-CM | POA: Diagnosis not present

## 2014-05-03 DIAGNOSIS — Z87828 Personal history of other (healed) physical injury and trauma: Secondary | ICD-10-CM | POA: Diagnosis not present

## 2014-05-03 DIAGNOSIS — D649 Anemia, unspecified: Secondary | ICD-10-CM | POA: Diagnosis not present

## 2014-05-03 DIAGNOSIS — Z9861 Coronary angioplasty status: Secondary | ICD-10-CM | POA: Diagnosis not present

## 2014-05-03 DIAGNOSIS — K529 Noninfective gastroenteritis and colitis, unspecified: Secondary | ICD-10-CM | POA: Insufficient documentation

## 2014-05-03 LAB — BASIC METABOLIC PANEL
Anion gap: 10 (ref 5–15)
BUN: 11 mg/dL (ref 6–23)
CO2: 24 mmol/L (ref 19–32)
Calcium: 9.4 mg/dL (ref 8.4–10.5)
Chloride: 106 mmol/L (ref 96–112)
Creatinine, Ser: 1.03 mg/dL (ref 0.50–1.35)
GFR calc Af Amer: 90 mL/min — ABNORMAL LOW (ref >90)
GFR calc non Af Amer: 78 mL/min — ABNORMAL LOW (ref >90)
Glucose, Bld: 123 mg/dL — ABNORMAL HIGH (ref 70–99)
POTASSIUM: 3.6 mmol/L (ref 3.5–5.1)
Sodium: 140 mmol/L (ref 135–145)

## 2014-05-03 LAB — CBC WITH DIFFERENTIAL/PLATELET
Basophils Absolute: 0 10*3/uL (ref 0.0–0.1)
Basophils Relative: 0 % (ref 0–1)
EOS ABS: 0.1 10*3/uL (ref 0.0–0.7)
Eosinophils Relative: 2 % (ref 0–5)
HCT: 39.3 % (ref 39.0–52.0)
Hemoglobin: 12.7 g/dL — ABNORMAL LOW (ref 13.0–17.0)
LYMPHS PCT: 24 % (ref 12–46)
Lymphs Abs: 1.4 10*3/uL (ref 0.7–4.0)
MCH: 25.9 pg — AB (ref 26.0–34.0)
MCHC: 32.3 g/dL (ref 30.0–36.0)
MCV: 80 fL (ref 78.0–100.0)
Monocytes Absolute: 0.5 10*3/uL (ref 0.1–1.0)
Monocytes Relative: 8 % (ref 3–12)
NEUTROS PCT: 66 % (ref 43–77)
Neutro Abs: 3.9 10*3/uL (ref 1.7–7.7)
PLATELETS: 280 10*3/uL (ref 150–400)
RBC: 4.91 MIL/uL (ref 4.22–5.81)
RDW: 14.9 % (ref 11.5–15.5)
WBC: 5.9 10*3/uL (ref 4.0–10.5)

## 2014-05-03 LAB — URINALYSIS, ROUTINE W REFLEX MICROSCOPIC
BILIRUBIN URINE: NEGATIVE
Glucose, UA: NEGATIVE mg/dL
KETONES UR: NEGATIVE mg/dL
NITRITE: NEGATIVE
PH: 6 (ref 5.0–8.0)
Protein, ur: NEGATIVE mg/dL
SPECIFIC GRAVITY, URINE: 1.025 (ref 1.005–1.030)
Urobilinogen, UA: 1 mg/dL (ref 0.0–1.0)

## 2014-05-03 LAB — URINE MICROSCOPIC-ADD ON

## 2014-05-03 LAB — PROTIME-INR
INR: 1.02 (ref 0.00–1.49)
PROTHROMBIN TIME: 13.6 s (ref 11.6–15.2)

## 2014-05-03 LAB — POC OCCULT BLOOD, ED: FECAL OCCULT BLD: NEGATIVE

## 2014-05-03 MED ORDER — ACETAMINOPHEN 325 MG PO TABS
650.0000 mg | ORAL_TABLET | Freq: Once | ORAL | Status: AC
  Administered 2014-05-03: 650 mg via ORAL
  Filled 2014-05-03: qty 2
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
## 2014-05-03 NOTE — ED Notes (Signed)
Reports hematuria since last night with mild pain, no other complaints, A/O X4, ambulatory and in NAD

## 2014-05-04 ENCOUNTER — Encounter (HOSPITAL_COMMUNITY): Payer: Self-pay

## 2014-05-04 ENCOUNTER — Emergency Department (HOSPITAL_COMMUNITY): Payer: Managed Care, Other (non HMO)

## 2014-05-04 ENCOUNTER — Ambulatory Visit (HOSPITAL_COMMUNITY): Payer: Managed Care, Other (non HMO)

## 2014-05-04 LAB — GC/CHLAMYDIA PROBE AMP (~~LOC~~) NOT AT ARMC
CHLAMYDIA, DNA PROBE: NEGATIVE
NEISSERIA GONORRHEA: NEGATIVE

## 2014-05-04 IMAGING — CT CT ABD-PELV W/O CM
2 of 4 series · 3 of 46 positions shown, 4 images · non-contrast
Comparison: 05/24/2006

CLINICAL DATA: Right mid abdominal pain and gross hematuria for 2
days. History kidney stones.

EXAM:
CT ABDOMEN AND PELVIS WITHOUT CONTRAST
TECHNIQUE: Multidetector CT imaging of the abdomen and pelvis was performed
following the standard protocol without IV contrast.

[Series 204: coronals · coronal · 0.50mm/px · 2 of 115 slices shown, 3 images]
[im 39/115  soft-tissue]
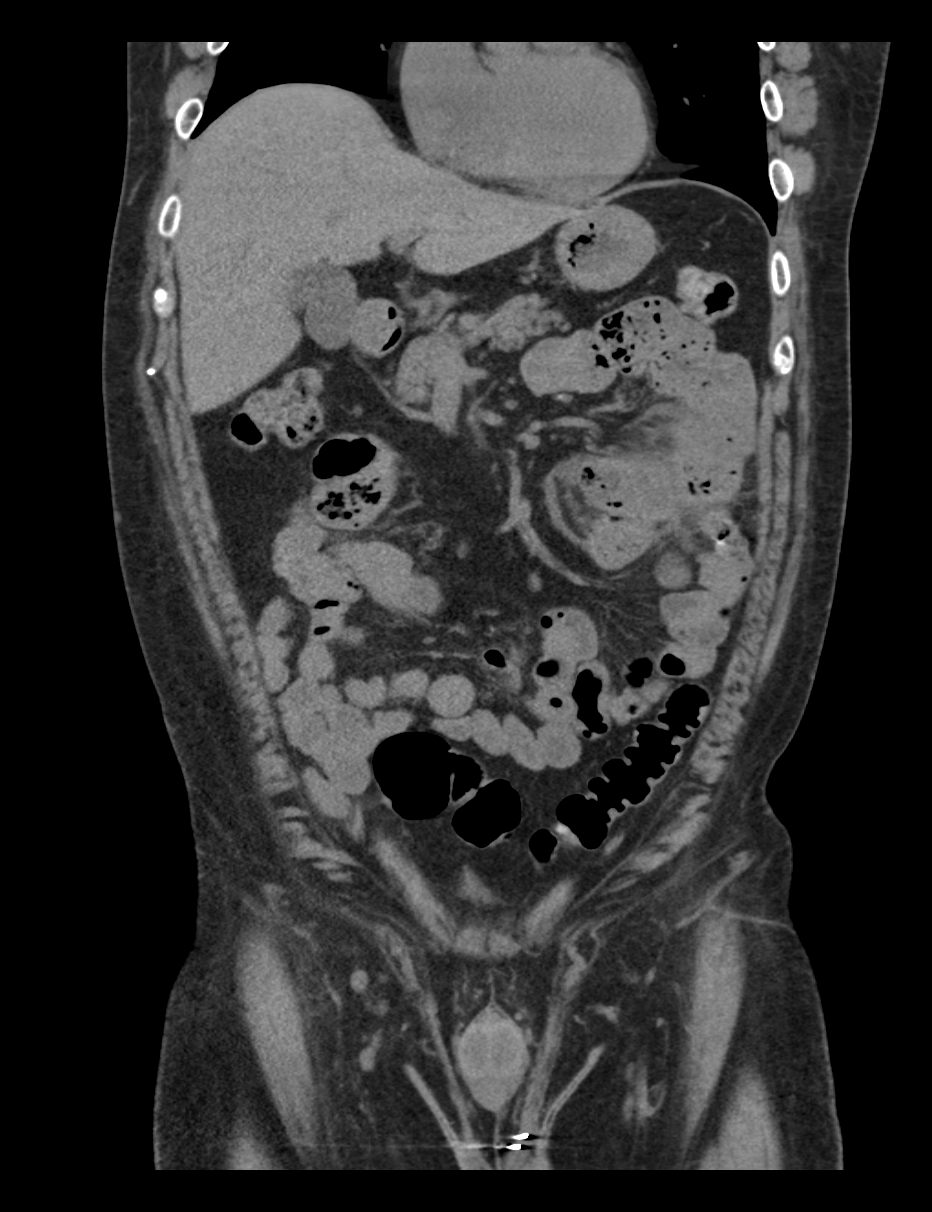
[im 39/115  bone]
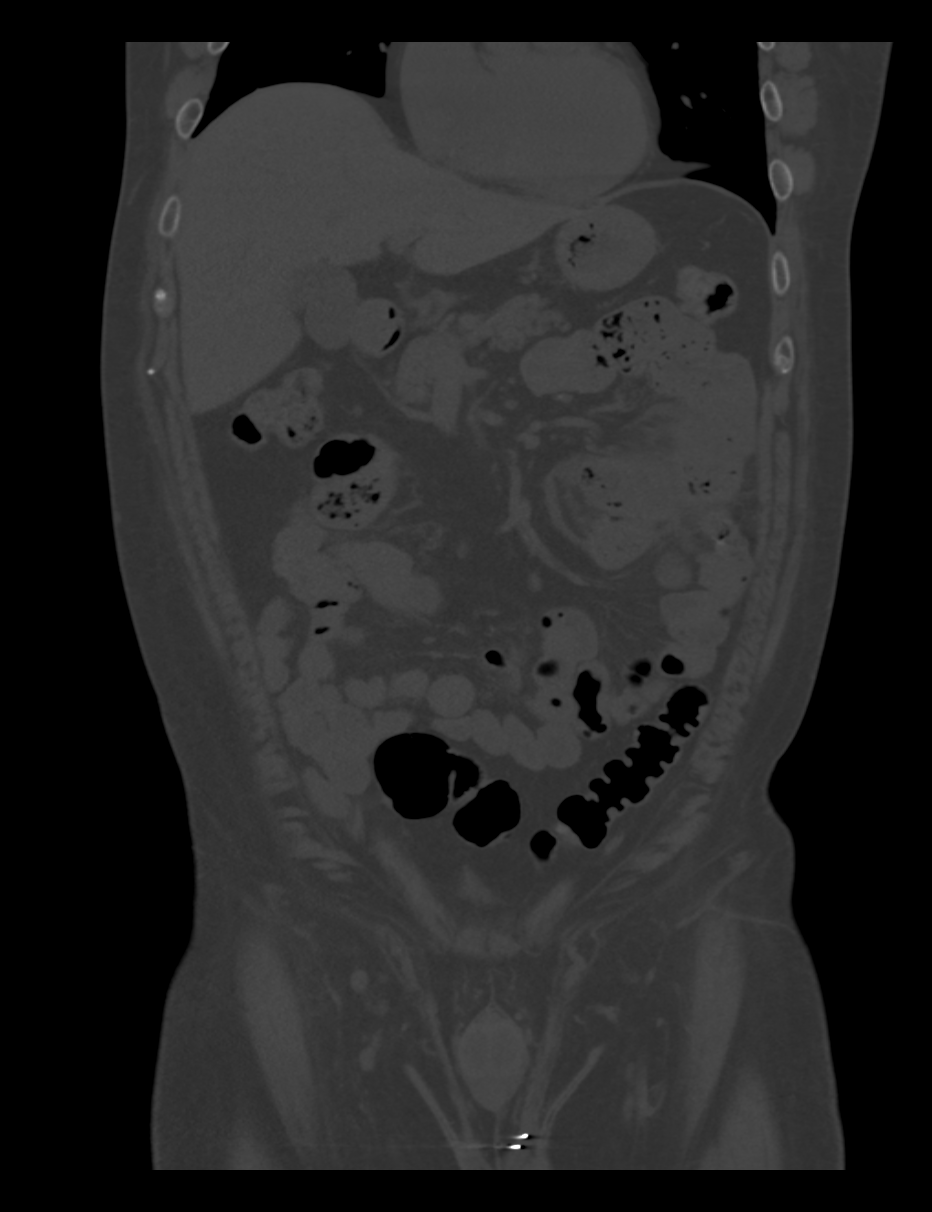
[im 89/115  soft-tissue]
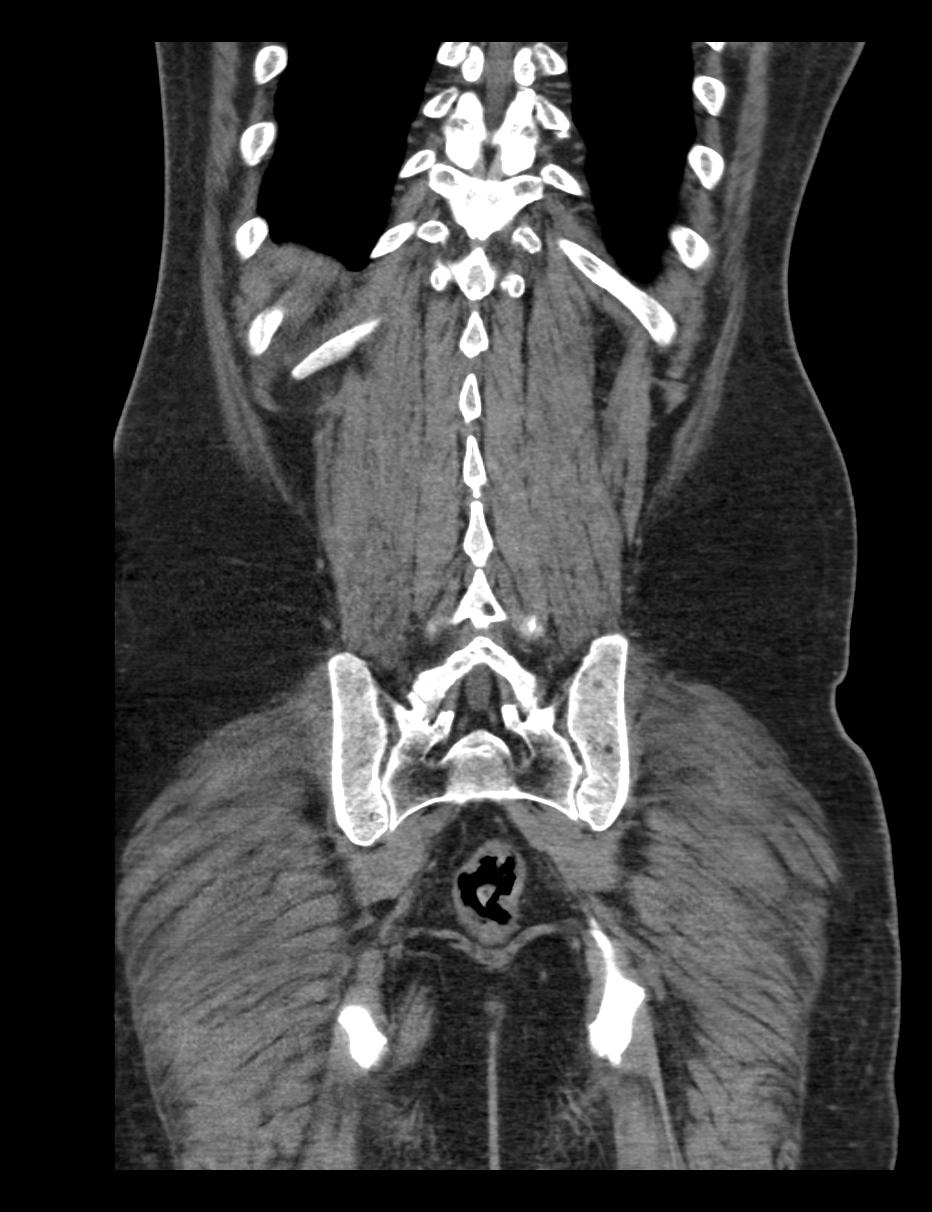

[Series 205: sagittals · sagittal · 0.50mm/px · 1 of 148 slices shown]
[im 50/148  soft-tissue]
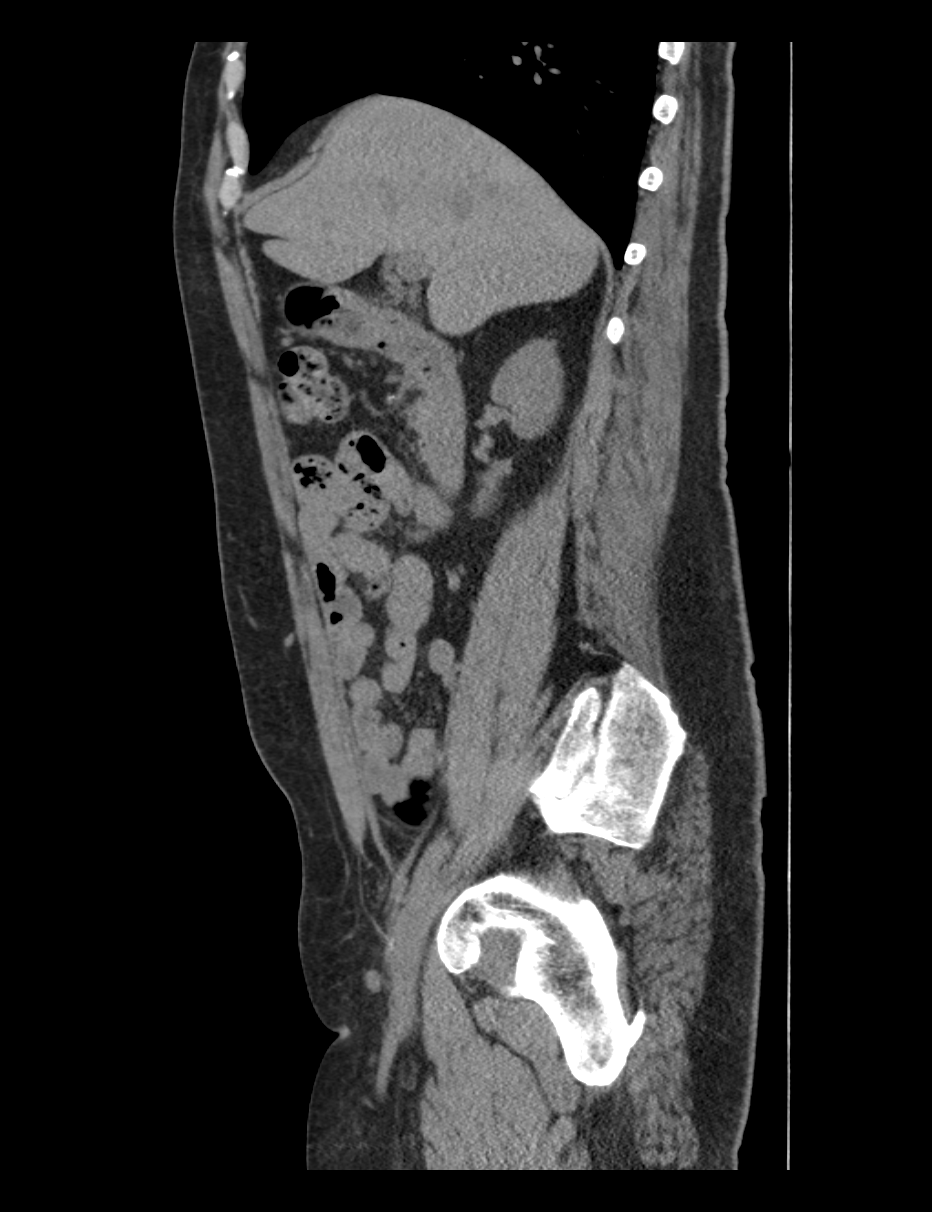

[3 of 46 positions shown; findings below may reference images not displayed]

FINDINGS: Lung bases are clear.  Coronary artery calcifications.

No hydronephrosis or hydroureter on either side. Tiny punctate size
calcifications demonstrated in the left kidney. No bladder wall
thickening or bladder stones identified.

The unenhanced appearance of the liver, spleen, gallbladder,
pancreas, adrenal glands, abdominal aorta, inferior vena cava, and
retroperitoneal lymph nodes is unremarkable. Stomach and small bowel
are not abnormally distended. However, the mid abdominal and left
upper quadrant small bowel loops or abnormal with probable wall
thickening and significant infiltration in the mesenteric. This may
represent enteritis of nonspecific etiology. No pneumatosis or
portal venous gas. No evidence of obstruction. Stool-filled colon
without wall thickening. Scattered colonic diverticula. Dystrophic
calcification in the anterior epigastric region without change since
prior study.

Pelvis: Prostate gland is mildly enlarged. Prostate calcifications.
No bladder wall thickening. No free or loculated pelvic fluid
collections. No pelvic mass or lymphadenopathy. Appendix is normal.
No destructive bone lesions.
IMPRESSION: Tiny nonobstructing stones in the left kidney. No hydronephrosis or
hydroureter. Abnormal appearing bowel in the left upper quadrant and
mid abdomen mid with mild small bowel wall thickening and
significant mesenteric infiltration. Suggestive enteritis/
mesenteritis of indeterminate etiology. No evidence of obstruction.

## 2014-05-04 MED ORDER — AZITHROMYCIN 250 MG PO TABS
1000.0000 mg | ORAL_TABLET | Freq: Once | ORAL | Status: AC
  Administered 2014-05-04: 1000 mg via ORAL
  Filled 2014-05-04: qty 4

## 2014-05-04 MED ORDER — CEFTRIAXONE SODIUM 250 MG IJ SOLR
250.0000 mg | Freq: Once | INTRAMUSCULAR | Status: AC
  Administered 2014-05-04: 250 mg via INTRAMUSCULAR
  Filled 2014-05-04: qty 250

## 2014-05-04 MED ORDER — CEPHALEXIN 500 MG PO CAPS: 500.0000 mg | ORAL_CAPSULE | Freq: Two times a day (BID) | ORAL | Status: DC

## 2014-05-04 NOTE — Discharge Instructions (Signed)
Return to the emergency room with worsening of symptoms, new symptoms or with symptoms that are concerning , especially fevers, abdominal pain in one area, unable to keep down fluids, blood in stool or vomit, severe pain, you feel faint, lightheaded or pass out. Please take all of your antibiotics until finished!   You may develop abdominal discomfort or diarrhea from the antibiotic.  You may help offset this with probiotics which you can buy or get in yogurt. Do not eat  or take the probiotics until 2 hours after your antibiotic.  Call to make follow up appointment with urology for the blood in your urine. Call to make appointment with [facility] for further workup and management of your enteritis, inflammation of your small bowel. Please call your doctor for a followup appointment within 24-48 hours. When you talk to your doctor please let them know that you were seen in the emergency department and have them acquire all of your records so that they can discuss the findings with you and formulate a treatment plan to fully care for your new and ongoing problems. Abstain from intercourse until you have finished your antibiotic, you have no symptoms and partners have been treated. Read below information and follow recommendations.  Hematuria Hematuria is blood in your urine. It can be caused by a bladder infection, kidney infection, prostate infection, kidney stone, or cancer of your urinary tract. Infections can usually be treated with medicine, and a kidney stone usually will pass through your urine. If neither of these is the cause of your hematuria, further workup to find out the reason may be needed. It is very important that you tell your health care provider about any blood you see in your urine, even if the blood stops without treatment or happens without causing pain. Blood in your urine that happens and then stops and then happens again can be a symptom of a very serious condition. Also, pain is not  a symptom in the initial stages of many urinary cancers. HOME CARE INSTRUCTIONS   Drink lots of fluid, 3-4 quarts a day. If you have been diagnosed with an infection, cranberry juice is especially recommended, in addition to large amounts of water.  Avoid caffeine, tea, and carbonated beverages because they tend to irritate the bladder.  Avoid alcohol because it may irritate the prostate.  Take all medicines as directed by your health care provider.  If you were prescribed an antibiotic medicine, finish it all even if you start to feel better.  If you have been diagnosed with a kidney stone, follow your health care provider's instructions regarding straining your urine to catch the stone.  Empty your bladder often. Avoid holding urine for long periods of time.  After a bowel movement, women should cleanse front to back. Use each tissue only once.  Empty your bladder before and after sexual intercourse if you are a male. SEEK MEDICAL CARE IF:  You develop back pain.  You have a fever.  You have a feeling of sickness in your stomach (nausea) or vomiting.  Your symptoms are not better in 3 days. Return sooner if you are getting worse. SEEK IMMEDIATE MEDICAL CARE IF:   You develop severe vomiting and are unable to keep the medicine down.  You develop severe back or abdominal pain despite taking your medicines.  You begin passing a large amount of blood or clots in your urine.  You feel extremely weak or faint, or you pass out. MAKE SURE YOU:  Understand these instructions.  Will watch your condition.  Will get help right away if you are not doing well or get worse. Document Released: 02/25/2005 Document Revised: 07/12/2013 Document Reviewed: 10/26/2012 [facility] Patient Information 2015 [place] [place]. This information is not intended to replace advice given to you by your health care provider. Make sure you discuss any questions you have with your health care  provider.

## 2014-05-05 LAB — URINE CULTURE
Colony Count: NO GROWTH
Culture: NO GROWTH

## 2014-05-05 LAB — HIV ANTIBODY (ROUTINE TESTING W REFLEX): HIV Screen 4th Generation wRfx: NONREACTIVE

## 2014-05-05 LAB — RPR: RPR: NONREACTIVE

## 2014-05-06 ENCOUNTER — Ambulatory Visit (HOSPITAL_COMMUNITY): Payer: Managed Care, Other (non HMO)

## 2015-01-27 ENCOUNTER — Other Ambulatory Visit: Payer: Self-pay | Admitting: Cardiology

## 2015-01-27 DIAGNOSIS — R079 Chest pain, unspecified: Secondary | ICD-10-CM

## 2015-02-01 ENCOUNTER — Encounter (HOSPITAL_COMMUNITY)
Admission: RE | Admit: 2015-02-01 | Discharge: 2015-02-01 | Disposition: A | Discharge status: Home / Self Care | Payer: Managed Care, Other (non HMO) | Source: Ambulatory Visit | Attending: Cardiology | Admitting: Cardiology

## 2015-02-01 DIAGNOSIS — R079 Chest pain, unspecified: Secondary | ICD-10-CM | POA: Diagnosis not present

## 2015-02-01 MED ORDER — REGADENOSON 0.4 MG/5ML IV SOLN
INTRAVENOUS | Status: AC
  Filled 2015-02-01: qty 5

## 2015-02-01 MED ORDER — TECHNETIUM TC 99M SESTAMIBI GENERIC - CARDIOLITE
30.0000 | Freq: Once | INTRAVENOUS | Status: AC | PRN
  Administered 2015-02-01: 30 via INTRAVENOUS

## 2015-02-01 MED ORDER — REGADENOSON 0.4 MG/5ML IV SOLN
0.4000 mg | Freq: Once | INTRAVENOUS | Status: AC
  Administered 2015-02-01: 0.4 mg via INTRAVENOUS

## 2015-02-01 MED ORDER — TECHNETIUM TC 99M SESTAMIBI GENERIC - CARDIOLITE
10.0000 | Freq: Once | INTRAVENOUS | Status: AC | PRN
  Administered 2015-02-01: 10 via INTRAVENOUS

## 2015-02-07 LAB — NM MYOCAR MULTI W/SPECT W/WALL MOTION / EF
CHL CUP STRESS STAGE 1 HR: 63 {beats}/min
CHL CUP STRESS STAGE 2 HR: 63 {beats}/min
CHL CUP STRESS STAGE 3 SPEED: 0 mph
CHL CUP STRESS STAGE 4 DBP: 65 mmHg
CHL CUP STRESS STAGE 4 GRADE: 0 %
CSEPPMHR: 51 %
Estimated workload: 1 METS
Peak BP: 135 mmHg
Peak HR: 83 {beats}/min
Stage 1 Grade: 0 %
Stage 1 Speed: 0 mph
Stage 2 Grade: 0 %
Stage 2 Speed: 0 mph
Stage 3 DBP: 71 mmHg
Stage 3 Grade: 0 %
Stage 3 HR: 83 {beats}/min
Stage 3 SBP: 133 mmHg
Stage 4 HR: 83 {beats}/min
Stage 4 SBP: 135 mmHg
Stage 4 Speed: 0 mph

## 2015-03-10 ENCOUNTER — Encounter: Payer: Self-pay | Admitting: Internal Medicine

## 2015-03-10 ENCOUNTER — Encounter (INDEPENDENT_AMBULATORY_CARE_PROVIDER_SITE_OTHER): Payer: Self-pay

## 2015-03-10 ENCOUNTER — Ambulatory Visit (INDEPENDENT_AMBULATORY_CARE_PROVIDER_SITE_OTHER): Payer: Managed Care, Other (non HMO) | Admitting: Internal Medicine

## 2015-03-10 VITALS — BP 118/58 | HR 68 | Temp 98.2°F | Ht 72.25 in | Wt 237.0 lb

## 2015-03-10 DIAGNOSIS — E785 Hyperlipidemia, unspecified: Secondary | ICD-10-CM | POA: Diagnosis not present

## 2015-03-10 DIAGNOSIS — I25700 Atherosclerosis of coronary artery bypass graft(s), unspecified, with unstable angina pectoris: Secondary | ICD-10-CM

## 2015-03-10 DIAGNOSIS — K219 Gastro-esophageal reflux disease without esophagitis: Secondary | ICD-10-CM

## 2015-03-10 DIAGNOSIS — G4700 Insomnia, unspecified: Secondary | ICD-10-CM

## 2015-03-10 DIAGNOSIS — I10 Essential (primary) hypertension: Secondary | ICD-10-CM | POA: Diagnosis not present

## 2015-03-10 DIAGNOSIS — E119 Type 2 diabetes mellitus without complications: Secondary | ICD-10-CM

## 2015-03-10 DIAGNOSIS — N400 Enlarged prostate without lower urinary tract symptoms: Secondary | ICD-10-CM

## 2015-03-10 MED ORDER — ESZOPICLONE 3 MG PO TABS: 3.0000 mg | ORAL_TABLET | Freq: Every day | ORAL | Status: DC

## 2015-03-10 NOTE — Progress Notes (Signed)
Pre visit review using our clinic review tool, if applicable. No additional management support is needed unless otherwise documented below in the visit note. 
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
## 2015-03-11 DIAGNOSIS — I25810 Atherosclerosis of coronary artery bypass graft(s) without angina pectoris: Secondary | ICD-10-CM | POA: Insufficient documentation

## 2015-03-11 DIAGNOSIS — N400 Enlarged prostate without lower urinary tract symptoms: Secondary | ICD-10-CM | POA: Insufficient documentation

## 2015-03-11 DIAGNOSIS — E785 Hyperlipidemia, unspecified: Secondary | ICD-10-CM | POA: Insufficient documentation

## 2015-03-11 DIAGNOSIS — E119 Type 2 diabetes mellitus without complications: Secondary | ICD-10-CM | POA: Insufficient documentation

## 2015-03-11 DIAGNOSIS — K219 Gastro-esophageal reflux disease without esophagitis: Secondary | ICD-10-CM | POA: Insufficient documentation

## 2015-03-11 DIAGNOSIS — I10 Essential (primary) hypertension: Secondary | ICD-10-CM | POA: Insufficient documentation

## 2015-03-11 NOTE — Patient Instructions (Signed)

## 2015-03-11 NOTE — Assessment & Plan Note (Signed)
Encouraged him to consume a low fat diet Continue Lipitor and ASA 

## 2015-03-11 NOTE — Assessment & Plan Note (Signed)
He will continue Lipitor, ASA and Plavix Encouraged him to consume a low fat diet He will continue to follow with cardiology

## 2015-03-11 NOTE — Assessment & Plan Note (Signed)
Continue Prilosec Try to avoid foods that trigger your reflux

## 2015-03-11 NOTE — Assessment & Plan Note (Signed)
Voids fine on Proscar and Flomax

## 2015-03-11 NOTE — Assessment & Plan Note (Signed)
He is refusing labs today Encouraged him to consume a low carb diet Flu shot UTD He declines pneumonia vaccine Encouraged him to make an appt for an eye exam Continue Metformin and Glipizide

## 2015-03-11 NOTE — Assessment & Plan Note (Signed)
Well controlled on Norvasc, Lisinopril HCT and Metoprolol Will request recent labs from cardiologist

## 2015-03-14 ENCOUNTER — Telehealth: Payer: Self-pay

## 2015-03-14 NOTE — Telephone Encounter (Signed)
Pt was seen on 03/10/15 [facility] [address] Apologized to pt and pt will ck with pharmacy.

## 2015-03-27 ENCOUNTER — Telehealth: Payer: Self-pay

## 2015-03-27 NOTE — Telephone Encounter (Signed)
Pt left v/m that lunesta is not working and pt request refill of ambien . Pt seen 03/10/15. Unable to reach pt by phone to verify what pharmacy.

## 2015-03-28 MED ORDER — ZOLPIDEM TARTRATE ER 12.5 MG PO TBCR: 12.5000 mg | EXTENDED_RELEASE_TABLET | Freq: Every evening | ORAL | Status: DC | PRN

## 2015-03-28 NOTE — Telephone Encounter (Signed)
Please phone in Ambien

## 2015-03-29 NOTE — Telephone Encounter (Signed)
Rx called in to pharmacy. 

## 2015-04-28 ENCOUNTER — Other Ambulatory Visit: Payer: Self-pay

## 2015-04-28 MED ORDER — ZOLPIDEM TARTRATE ER 12.5 MG PO TBCR: 12.5000 mg | EXTENDED_RELEASE_TABLET | Freq: Every evening | ORAL | Status: DC | PRN

## 2015-04-28 NOTE — Telephone Encounter (Signed)
Approved: 30 x 0 

## 2015-04-28 NOTE — Telephone Encounter (Signed)
Rx called in to pharmacy. 

## 2015-04-28 NOTE — Telephone Encounter (Signed)
Pt left v/m requesting refill zolpidem to [facility] [address] Last refilled # 30 on 03/28/15;pt established care on 03/10/15. Pamala Hurry NP is out of office and limited access to computer. Please advise.

## 2015-05-25 ENCOUNTER — Other Ambulatory Visit: Payer: Self-pay | Admitting: Internal Medicine

## 2015-05-25 MED ORDER — ZOLPIDEM TARTRATE ER 12.5 MG PO TBCR: 12.5000 mg | EXTENDED_RELEASE_TABLET | Freq: Every evening | ORAL | Status: DC | PRN

## 2015-05-25 NOTE — Telephone Encounter (Signed)
Left refill on voice mail at pharmacy  

## 2015-05-25 NOTE — Telephone Encounter (Signed)
Ok to phone in Ambien 

## 2015-05-25 NOTE — Telephone Encounter (Signed)
Pt left voicemail requesting [facility]ill of [facility], last refilled on 04/28/15 #30 with 0 refills, pt has CPE scheduled on 09/06/15

## 2015-07-10 LAB — HM DIABETES EYE EXAM

## 2015-07-11 ENCOUNTER — Other Ambulatory Visit: Payer: Self-pay

## 2015-07-11 MED ORDER — ZOLPIDEM TARTRATE ER 12.5 MG PO TBCR: 12.5000 mg | EXTENDED_RELEASE_TABLET | Freq: Every evening | ORAL | Status: DC | PRN

## 2015-07-11 NOTE — Telephone Encounter (Signed)
Pt left v/m requesting refill zolpidem to [facility] [address] Last refilled # 30 on 05/25/15. Last seen when established care on 03/10/15.

## 2015-07-11 NOTE — Telephone Encounter (Signed)
Ok to phone in Ambien 

## 2015-07-11 NOTE — Telephone Encounter (Signed)
Rx called in to pharmacy. 

## 2015-07-13 ENCOUNTER — Other Ambulatory Visit: Payer: Self-pay | Admitting: Cardiology

## 2015-07-13 DIAGNOSIS — R079 Chest pain, unspecified: Secondary | ICD-10-CM

## 2015-07-21 ENCOUNTER — Encounter (HOSPITAL_COMMUNITY)
Admission: RE | Admit: 2015-07-21 | Discharge: 2015-07-21 | Disposition: A | Discharge status: Home / Self Care | Payer: Managed Care, Other (non HMO) | Source: Ambulatory Visit | Attending: Cardiology | Admitting: Cardiology

## 2015-07-21 DIAGNOSIS — R079 Chest pain, unspecified: Secondary | ICD-10-CM | POA: Insufficient documentation

## 2015-07-21 MED ORDER — REGADENOSON 0.4 MG/5ML IV SOLN
0.4000 mg | Freq: Once | INTRAVENOUS | Status: AC
  Administered 2015-07-21: 0.4 mg via INTRAVENOUS

## 2015-07-21 MED ORDER — TECHNETIUM TC 99M SESTAMIBI - CARDIOLITE
30.0000 | Freq: Once | INTRAVENOUS | Status: AC | PRN
  Administered 2015-07-21: 12:00:00 30 via INTRAVENOUS

## 2015-07-21 MED ORDER — TECHNETIUM TC 99M SESTAMIBI GENERIC - CARDIOLITE
10.0000 | Freq: Once | INTRAVENOUS | Status: AC | PRN
  Administered 2015-07-21: 10 via INTRAVENOUS

## 2015-07-21 MED ORDER — REGADENOSON 0.4 MG/5ML IV SOLN
INTRAVENOUS | Status: AC
  Filled 2015-07-21: qty 5

## 2015-07-24 ENCOUNTER — Telehealth: Payer: Self-pay

## 2015-07-24 MED ORDER — GLIPIZIDE 10 MG PO TABS: 10.0000 mg | ORAL_TABLET | Freq: Two times a day (BID) | ORAL | Status: DC

## 2015-07-24 NOTE — Telephone Encounter (Signed)
Pt request 90 day refill glipizide to [facility] [address] Pt seen 03/10/15 to establish and advised to continue glipizide. Pt has CPX on 09/06/15. Left v/m for pt to ck with pharmacy.

## 2015-08-11 ENCOUNTER — Other Ambulatory Visit: Payer: Self-pay

## 2015-08-11 MED ORDER — ZOLPIDEM TARTRATE ER 12.5 MG PO TBCR: 12.5000 mg | EXTENDED_RELEASE_TABLET | Freq: Every evening | ORAL | Status: DC | PRN

## 2015-08-11 NOTE — Telephone Encounter (Signed)
Ok to phone in Ambien 

## 2015-08-11 NOTE — Telephone Encounter (Signed)
Pt request refill zolpidem to [facility] [address] Last refilled # 30 on 07/11/15. Pt last seen 03/10/15 to establish care.Please advise.

## 2015-08-11 NOTE — Telephone Encounter (Signed)
Rx called in to pharmacy. 

## 2015-09-06 ENCOUNTER — Ambulatory Visit (INDEPENDENT_AMBULATORY_CARE_PROVIDER_SITE_OTHER): Payer: Managed Care, Other (non HMO) | Admitting: Internal Medicine

## 2015-09-06 ENCOUNTER — Encounter: Payer: Self-pay | Admitting: Internal Medicine

## 2015-09-06 VITALS — BP 120/64 | HR 64 | Temp 98.4°F | Ht 72.0 in | Wt 236.5 lb

## 2015-09-06 DIAGNOSIS — G4700 Insomnia, unspecified: Secondary | ICD-10-CM

## 2015-09-06 DIAGNOSIS — N400 Enlarged prostate without lower urinary tract symptoms: Secondary | ICD-10-CM

## 2015-09-06 DIAGNOSIS — I10 Essential (primary) hypertension: Secondary | ICD-10-CM

## 2015-09-06 DIAGNOSIS — E785 Hyperlipidemia, unspecified: Secondary | ICD-10-CM

## 2015-09-06 DIAGNOSIS — Z1159 Encounter for screening for other viral diseases: Secondary | ICD-10-CM

## 2015-09-06 DIAGNOSIS — Z0000 Encounter for general adult medical examination without abnormal findings: Secondary | ICD-10-CM

## 2015-09-06 DIAGNOSIS — Z0001 Encounter for general adult medical examination with abnormal findings: Secondary | ICD-10-CM

## 2015-09-06 DIAGNOSIS — I25700 Atherosclerosis of coronary artery bypass graft(s), unspecified, with unstable angina pectoris: Secondary | ICD-10-CM

## 2015-09-06 DIAGNOSIS — Z23 Encounter for immunization: Secondary | ICD-10-CM | POA: Diagnosis not present

## 2015-09-06 DIAGNOSIS — Z125 Encounter for screening for malignant neoplasm of prostate: Secondary | ICD-10-CM | POA: Diagnosis not present

## 2015-09-06 DIAGNOSIS — E119 Type 2 diabetes mellitus without complications: Secondary | ICD-10-CM

## 2015-09-06 DIAGNOSIS — K219 Gastro-esophageal reflux disease without esophagitis: Secondary | ICD-10-CM | POA: Diagnosis not present

## 2015-09-06 DIAGNOSIS — M25562 Pain in left knee: Secondary | ICD-10-CM

## 2015-09-06 MED ORDER — ZOLPIDEM TARTRATE ER 12.5 MG PO TBCR
12.5000 mg | EXTENDED_RELEASE_TABLET | Freq: Every evening | ORAL | Status: DC | PRN
Start: 2015-09-06 — End: 2015-10-10

## 2015-09-06 NOTE — Assessment & Plan Note (Signed)
A1C and lipid profile today No microalbumin secondary to ACEI therapy Foot exam today Flu shot UTD Pneumovax today Eye exam scheduled- have them send me a copy of the report Continue Metformin and Glipizide

## 2015-09-06 NOTE — Assessment & Plan Note (Addendum)
Will check lipid profile today Continue Toprol, Lipitor, Plavix and ASA

## 2015-09-06 NOTE — Addendum Note (Signed)
Addended by: Alvina ChouWALSH, Loucinda Croy J on: 09/06/2015 03:52 PM   Modules accepted: Orders, SmartSet

## 2015-09-06 NOTE — Assessment & Plan Note (Signed)
No issues on Prilosec CMET today

## 2015-09-06 NOTE — Assessment & Plan Note (Signed)
Lipid profile today Continue Lipitor Encouraged him to consume a low fat diet

## 2015-09-06 NOTE — Addendum Note (Signed)
Addended by: Roena MaladyEVONTENNO, MELANIE Y on: 09/06/2015 05:04 PM   Modules accepted: Orders, SmartSet

## 2015-09-06 NOTE — Assessment & Plan Note (Signed)
Continue Flomax and Proscar. 

## 2015-09-06 NOTE — Assessment & Plan Note (Signed)
Ambien refilled today 

## 2015-09-06 NOTE — Progress Notes (Signed)
Pre visit review using our clinic review tool, if applicable. No additional management support is needed unless otherwise documented below in the visit note. 

## 2015-09-06 NOTE — Patient Instructions (Signed)
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
## 2015-09-06 NOTE — Assessment & Plan Note (Signed)
Controlled on Lisinopril HCT and Norvasc

## 2015-09-07 LAB — COMPREHENSIVE METABOLIC PANEL
ALBUMIN: 4.1 g/dL (ref 3.5–5.2)
ALK PHOS: 82 U/L (ref 39–117)
ALT: 11 U/L (ref 0–53)
AST: 10 U/L (ref 0–37)
BUN: 11 mg/dL (ref 6–23)
CO2: 32 mEq/L (ref 19–32)
Calcium: 9.5 mg/dL (ref 8.4–10.5)
Chloride: 105 mEq/L (ref 96–112)
Creatinine, Ser: 0.89 mg/dL (ref 0.40–1.50)
GFR: 92.36 mL/min (ref >60.00)
Glucose, Bld: 79 mg/dL (ref 70–99)
Potassium: 3.9 mEq/L (ref 3.5–5.1)
Sodium: 143 mEq/L (ref 135–145)
TOTAL PROTEIN: 6.5 g/dL (ref 6.0–8.3)
Total Bilirubin: 0.5 mg/dL (ref 0.2–1.2)

## 2015-09-07 LAB — LIPID PANEL
Cholesterol: 129 mg/dL (ref 0–200)
HDL: 39.2 mg/dL (ref >39.00)
LDL Cholesterol: 74 mg/dL (ref 0–99)
NonHDL: 90.22
TRIGLYCERIDES: 80 mg/dL (ref 0.0–149.0)
Total CHOL/HDL Ratio: 3
VLDL: 16 mg/dL (ref 0.0–40.0)

## 2015-09-07 LAB — CBC
HCT: 38 % — ABNORMAL LOW (ref 39.0–52.0)
Hemoglobin: 12.5 g/dL — ABNORMAL LOW (ref 13.0–17.0)
MCHC: 32.8 g/dL (ref 30.0–36.0)
MCV: 81.1 fl (ref 78.0–100.0)
Platelets: 285 10*3/uL (ref 150.0–400.0)
RBC: 4.69 Mil/uL (ref 4.22–5.81)
RDW: 14.5 % (ref 11.5–15.5)
WBC: 5 10*3/uL (ref 4.0–10.5)

## 2015-09-07 LAB — HEPATITIS C ANTIBODY: HCV Ab: NEGATIVE

## 2015-09-07 LAB — PSA: PSA: 0.11 ng/mL (ref 0.10–4.00)

## 2015-09-07 LAB — HEMOGLOBIN A1C: HEMOGLOBIN A1C: 6.9 % — AB (ref 4.6–6.5)

## 2015-10-09 ENCOUNTER — Telehealth: Payer: Self-pay | Admitting: Internal Medicine

## 2015-10-09 DIAGNOSIS — G4700 Insomnia, unspecified: Secondary | ICD-10-CM

## 2015-10-09 NOTE — Telephone Encounter (Signed)
Pt left VM on Triage phone requesting zolpidem (AMBIEN CR) 12.5 MG  Refill to [facility].  Best number to cll is [phone]

## 2015-10-10 MED ORDER — ZOLPIDEM TARTRATE ER 12.5 MG PO TBCR: 12.5000 mg | EXTENDED_RELEASE_TABLET | Freq: Every evening | ORAL | 0 refills | Status: DC | PRN

## 2015-10-10 NOTE — Telephone Encounter (Signed)
Rx called in to pharmacy. 

## 2015-10-10 NOTE — Telephone Encounter (Signed)
Pt called to ck on status of zolpidem refill; advised done and pt will ck with pharmacy.

## 2015-10-10 NOTE — Telephone Encounter (Signed)
Ok to phone in Ambien 

## 2015-10-10 NOTE — Addendum Note (Signed)
Addended by: Roena Malady on: 10/10/2015 01:51 PM   Modules accepted: Orders

## 2015-10-23 MED ORDER — GLIPIZIDE 10 MG PO TABS: 10.0000 mg | ORAL_TABLET | Freq: Two times a day (BID) | ORAL | 0 refills | Status: DC

## 2015-10-23 NOTE — Telephone Encounter (Signed)
Ok to refill Glipizide

## 2015-10-23 NOTE — Telephone Encounter (Signed)
Rx sent through e-scribe  

## 2015-10-23 NOTE — Telephone Encounter (Signed)
Pt request 90 day refill glipizide to [facility] [address] [facility] Pt seen 0628/17 to establish and advised to continue glipizide. Pt has CPX on 09/06/15.

## 2015-10-23 NOTE — Addendum Note (Signed)
Addended by: Roena MaladyEVONTENNO, Dilan Novosad Y on: 10/23/2015 05:05 PM   Modules accepted: Orders

## 2015-11-14 ENCOUNTER — Other Ambulatory Visit: Payer: Self-pay

## 2015-11-14 DIAGNOSIS — G4700 Insomnia, unspecified: Secondary | ICD-10-CM

## 2015-11-14 MED ORDER — ZOLPIDEM TARTRATE ER 12.5 MG PO TBCR: 12.5000 mg | EXTENDED_RELEASE_TABLET | Freq: Every evening | ORAL | 0 refills | Status: DC | PRN

## 2015-11-14 NOTE — Telephone Encounter (Signed)
Rx called in to pharmacy. 

## 2015-11-14 NOTE — Telephone Encounter (Signed)
Pt left vm requesting refill zolpidem to [facility] [address]last refilled # 30 on 10/10/15; last annual exam on 09/06/15.

## 2015-11-14 NOTE — Telephone Encounter (Signed)
Ok to phone in [place]

## 2015-12-13 ENCOUNTER — Other Ambulatory Visit: Payer: Self-pay

## 2015-12-13 MED ORDER — LISINOPRIL-HYDROCHLOROTHIAZIDE 10-12.5 MG PO TABS: 1.0000 | ORAL_TABLET | Freq: Every day | ORAL | 2 refills | Status: DC

## 2015-12-13 MED ORDER — ZOLPIDEM TARTRATE ER 12.5 MG PO TBCR: 12.5000 mg | EXTENDED_RELEASE_TABLET | Freq: Every evening | ORAL | 0 refills | Status: DC | PRN

## 2015-12-13 NOTE — Telephone Encounter (Signed)
Rx called in to pharmacy. 

## 2015-12-13 NOTE — Telephone Encounter (Signed)
Ok to phone in Ambien 

## 2015-12-13 NOTE — Telephone Encounter (Signed)
Pt request refill zolpidem (last refilled # 30 on 11/14/15) and lisinopril HCTZ to [facility] [address] [facility] Pt last annual 09/06/15. Refilled lisinopril HCTZ per protocol and zolpidem sent to Pamala Hurry Baity NP for approval. Pt voiced understanding.

## 2016-01-11 ENCOUNTER — Other Ambulatory Visit: Payer: Self-pay

## 2016-01-11 MED ORDER — ZOLPIDEM TARTRATE ER 12.5 MG PO TBCR: 12.5000 mg | EXTENDED_RELEASE_TABLET | Freq: Every evening | ORAL | 0 refills | Status: DC | PRN

## 2016-01-11 NOTE — Telephone Encounter (Signed)
Pt left v/m requesting refill zolpidem to [facility] [address] Last refilled # 30 on 12/13/15. Pt last annual 09/06/15.

## 2016-01-11 NOTE — Telephone Encounter (Signed)
Rx called in to pharmacy. 

## 2016-01-11 NOTE — Telephone Encounter (Signed)
Ok to phone in Ambien 

## 2016-01-18 ENCOUNTER — Other Ambulatory Visit: Payer: Self-pay | Admitting: Internal Medicine

## 2016-02-12 ENCOUNTER — Other Ambulatory Visit: Payer: Self-pay | Admitting: *Deleted

## 2016-02-12 MED ORDER — ZOLPIDEM TARTRATE ER 12.5 MG PO TBCR: 12.5000 mg | EXTENDED_RELEASE_TABLET | Freq: Every evening | ORAL | 0 refills | Status: DC | PRN

## 2016-02-12 NOTE — Telephone Encounter (Signed)
Rx called in to pharmacy. 

## 2016-02-12 NOTE — Telephone Encounter (Signed)
Patient left a voicemail requesting a refill on Zolpidem Last refill 01/11/16 #30 Last office visit 09/06/15

## 2016-02-12 NOTE — Telephone Encounter (Signed)
Ok to phone in Ambien 

## 2016-03-07 ENCOUNTER — Encounter: Payer: Self-pay | Admitting: Internal Medicine

## 2016-03-07 ENCOUNTER — Ambulatory Visit (INDEPENDENT_AMBULATORY_CARE_PROVIDER_SITE_OTHER): Payer: Managed Care, Other (non HMO) | Admitting: Internal Medicine

## 2016-03-07 ENCOUNTER — Other Ambulatory Visit: Payer: Self-pay

## 2016-03-07 VITALS — BP 122/70 | HR 74 | Temp 97.8°F | Wt 240.0 lb

## 2016-03-07 DIAGNOSIS — K219 Gastro-esophageal reflux disease without esophagitis: Secondary | ICD-10-CM

## 2016-03-07 DIAGNOSIS — E119 Type 2 diabetes mellitus without complications: Secondary | ICD-10-CM

## 2016-03-07 DIAGNOSIS — N400 Enlarged prostate without lower urinary tract symptoms: Secondary | ICD-10-CM

## 2016-03-07 DIAGNOSIS — I25700 Atherosclerosis of coronary artery bypass graft(s), unspecified, with unstable angina pectoris: Secondary | ICD-10-CM | POA: Diagnosis not present

## 2016-03-07 DIAGNOSIS — I10 Essential (primary) hypertension: Secondary | ICD-10-CM

## 2016-03-07 DIAGNOSIS — F5101 Primary insomnia: Secondary | ICD-10-CM

## 2016-03-07 DIAGNOSIS — E7800 Pure hypercholesterolemia, unspecified: Secondary | ICD-10-CM

## 2016-03-07 LAB — COMPREHENSIVE METABOLIC PANEL
ALK PHOS: 78 U/L (ref 39–117)
ALT: 10 U/L (ref 0–53)
AST: 10 U/L (ref 0–37)
Albumin: 4.1 g/dL (ref 3.5–5.2)
BILIRUBIN TOTAL: 0.4 mg/dL (ref 0.2–1.2)
BUN: 9 mg/dL (ref 6–23)
CALCIUM: 9.3 mg/dL (ref 8.4–10.5)
CO2: 33 meq/L — AB (ref 19–32)
CREATININE: 0.87 mg/dL (ref 0.40–1.50)
Chloride: 103 mEq/L (ref 96–112)
GFR: 94.66 mL/min (ref >60.00)
GLUCOSE: 146 mg/dL — AB (ref 70–99)
Potassium: 4.1 mEq/L (ref 3.5–5.1)
Sodium: 140 mEq/L (ref 135–145)
TOTAL PROTEIN: 6.7 g/dL (ref 6.0–8.3)

## 2016-03-07 LAB — LIPID PANEL
CHOL/HDL RATIO: 3
Cholesterol: 124 mg/dL (ref 0–200)
HDL: 38.2 mg/dL — AB (ref >39.00)
LDL Cholesterol: 76 mg/dL (ref 0–99)
NONHDL: 86.14
Triglycerides: 50 mg/dL (ref 0.0–149.0)
VLDL: 10 mg/dL (ref 0.0–40.0)

## 2016-03-07 LAB — HEMOGLOBIN A1C: Hgb A1c MFr Bld: 7.1 % — ABNORMAL HIGH (ref 4.6–6.5)

## 2016-03-07 MED ORDER — ZOLPIDEM TARTRATE ER 12.5 MG PO TBCR: 12.5000 mg | EXTENDED_RELEASE_TABLET | Freq: Every evening | ORAL | 0 refills | Status: DC | PRN

## 2016-03-07 NOTE — Assessment & Plan Note (Signed)
Controlled on Lisinopril-HCT, Norvasc and Metoprolol CMET today

## 2016-03-07 NOTE — Assessment & Plan Note (Signed)
No recent chest pain He has a follow up with Cardiology next week Lipid profile and CMET today Continue Metoprolol, ASA and Plavix for now

## 2016-03-07 NOTE — Telephone Encounter (Signed)
This was last filled 02/12/16--please advise if you would still like me to call in

## 2016-03-07 NOTE — Assessment & Plan Note (Signed)
Controlled on Ambien CR Refilled today
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
## 2016-03-07 NOTE — Telephone Encounter (Signed)
Correction 03/14/16

## 2016-03-07 NOTE — Assessment & Plan Note (Signed)
Discussed avoiding triggers He will continue Prilosec for now CMET today

## 2016-03-07 NOTE — Assessment & Plan Note (Signed)
A1C today No microalbumin secondary to ACEI therapy Having some diarrhea with Metformin but he reports he can tolerate it, will continue for now Continue Glipizide as well Advised him to consume a low carb, low fat diet Flu and pneumovax UTD Encouraged yearly eye exam Foot exam today

## 2016-03-07 NOTE — Assessment & Plan Note (Signed)
Controlled on Flomax and Proscar Will continue for now

## 2016-03-07 NOTE — Telephone Encounter (Signed)
oK to fill on or after 03/14/05

## 2016-03-07 NOTE — Patient Instructions (Signed)
Fat and Cholesterol Restricted Diet Introduction Getting too much fat and cholesterol in your diet may cause health problems. Following this diet helps keep your fat and cholesterol at normal levels. This can keep you from getting sick. What types of fat should I choose?  Choose monosaturated and polyunsaturated fats. These are found in foods such as olive oil, canola oil, flaxseeds, walnuts, almonds, and seeds.  Eat more omega-3 fats. Good choices include salmon, mackerel, sardines, tuna, flaxseed oil, and ground flaxseeds.  Limit saturated fats. These are in animal products such as meats, butter, and cream. They can also be in plant products such as palm oil, palm kernel oil, and coconut oil.  Avoid foods with partially hydrogenated oils in them. These contain trans fats. Examples of foods that have trans fats are stick margarine, some tub margarines, cookies, crackers, and other baked goods. What general guidelines do I need to follow?  Check food labels. Look for the words "trans fat" and "saturated fat."  When preparing a meal:  Fill half of your plate with vegetables and green salads.  Fill one fourth of your plate with whole grains. Look for the word "whole" as the first word in the ingredient list.  Fill one fourth of your plate with lean protein foods.  Eat more foods that have fiber, like apples, carrots, beans, peas, and barley.  Eat more home-cooked foods. Eat less at restaurants and buffets.  Limit or avoid alcohol.  Limit foods high in starch and sugar.  Limit fried foods.  Cook foods without frying them. Baking, boiling, grilling, and broiling are all great options.  Lose weight if you are overweight. Losing even a small amount of weight can help your overall health. It can also help prevent diseases such as diabetes and heart disease. What foods can I eat? Grains  Whole grains, such as whole wheat or whole grain breads, crackers, cereals, and pasta. Unsweetened  oatmeal, bulgur, barley, quinoa, or brown rice. Corn or whole wheat flour tortillas. Vegetables  Fresh or frozen vegetables (raw, steamed, roasted, or grilled). Green salads. Fruits  All fresh, canned (in natural juice), or frozen fruits. Meat and Other Protein Products  Ground beef (85% or leaner), grass-fed beef, or beef trimmed of fat. Skinless chicken or turkey. Ground chicken or turkey. Pork trimmed of fat. All fish and seafood. Eggs. Dried beans, peas, or lentils. Unsalted nuts or seeds. Unsalted canned or dry beans. Dairy  Low-fat dairy products, such as skim or 1% milk, 2% or reduced-fat cheeses, low-fat ricotta or cottage cheese, or plain low-fat yogurt. Fats and Oils  Tub margarines without trans fats. Light or reduced-fat mayonnaise and salad dressings. Avocado. Olive, canola, sesame, or safflower oils. Natural peanut or almond butter (choose ones without added sugar and oil). The items listed above may not be a complete list of recommended foods or beverages. Contact your dietitian for more options.  What foods are not recommended? Grains  White bread. White pasta. White rice. Cornbread. Bagels, pastries, and croissants. Crackers that contain trans fat. Vegetables  White potatoes. Corn. Creamed or fried vegetables. Vegetables in a cheese sauce. Fruits  Dried fruits. Canned fruit in light or heavy syrup. Fruit juice. Meat and Other Protein Products  Fatty cuts of meat. Ribs, chicken wings, bacon, sausage, bologna, salami, chitterlings, fatback, hot dogs, bratwurst, and packaged luncheon meats. Liver and organ meats. Dairy  Whole or 2% milk, cream, half-and-half, and cream cheese. Whole milk cheeses. Whole-fat or sweetened yogurt. Full-fat cheeses. Nondairy creamers and   whipped toppings. Processed cheese, cheese spreads, or cheese curds. Sweets and Desserts  Corn syrup, sugars, honey, and molasses. Candy. Jam and jelly. Syrup. Sweetened cereals. Cookies, pies, cakes, donuts,  muffins, and ice cream. Fats and Oils  Butter, stick margarine, lard, shortening, ghee, or bacon fat. Coconut, palm kernel, or palm oils. Beverages  Alcohol. Sweetened drinks (such as sodas, lemonade, and fruit drinks or punches). The items listed above may not be a complete list of foods and beverages to avoid. Contact your dietitian for more information.  This information is not intended to replace advice given to you by your health care provider. Make sure you discuss any questions you have with your health care provider. Document Released: 08/27/2011 Document Revised: 11/02/2015 Document Reviewed: 05/27/2013  2017 [employer]vier  

## 2016-03-07 NOTE — Assessment & Plan Note (Signed)
CMET and Lipid profile today Encouraged him to consume a low fat diet He will continue Lipitor for now

## 2016-03-12 NOTE — Telephone Encounter (Signed)
Rx called in to pharmacy. 

## 2016-04-10 ENCOUNTER — Other Ambulatory Visit: Payer: Self-pay

## 2016-04-10 MED ORDER — ZOLPIDEM TARTRATE ER 12.5 MG PO TBCR: 12.5000 mg | EXTENDED_RELEASE_TABLET | Freq: Every evening | ORAL | 0 refills | Status: DC | PRN

## 2016-04-10 NOTE — Telephone Encounter (Signed)
Pt left v//m requesting refill zolpidem to [facility] [address] [facility] Pt last seen and rx last refilled # 30 on 03/07/16.

## 2016-04-10 NOTE — Telephone Encounter (Signed)
Ok to phone in Ambien 

## 2016-04-10 NOTE — Telephone Encounter (Signed)
Rx called in to pharmacy. 

## 2016-04-15 ENCOUNTER — Other Ambulatory Visit: Payer: Self-pay | Admitting: Internal Medicine

## 2016-05-15 ENCOUNTER — Other Ambulatory Visit: Payer: Self-pay

## 2016-05-15 MED ORDER — ZOLPIDEM TARTRATE ER 12.5 MG PO TBCR: 12.5000 mg | EXTENDED_RELEASE_TABLET | Freq: Every evening | ORAL | 0 refills | Status: DC | PRN

## 2016-05-15 NOTE — Telephone Encounter (Signed)
Ok to phone in Ambien 

## 2016-05-15 NOTE — Telephone Encounter (Signed)
rx was phoned in

## 2016-05-15 NOTE — Telephone Encounter (Signed)
Pt left VM requesting a refill for ZOLPIDEM 12.5 MG CR tab. Last refill 03/28/2016, last  OV 03/07/16. Ok to refill?   [id number]- [place], [place] - [address] [phone] (Phone) [phone] (Fax)

## 2016-06-03 ENCOUNTER — Emergency Department (HOSPITAL_COMMUNITY)
Admission: EM | Admit: 2016-06-03 | Discharge: 2016-06-03 | Disposition: A | Discharge status: Home / Self Care | Payer: 59 | Attending: Emergency Medicine | Admitting: Emergency Medicine

## 2016-06-03 ENCOUNTER — Emergency Department (HOSPITAL_COMMUNITY): Payer: 59

## 2016-06-03 ENCOUNTER — Encounter (HOSPITAL_COMMUNITY): Payer: Self-pay

## 2016-06-03 DIAGNOSIS — I2510 Atherosclerotic heart disease of native coronary artery without angina pectoris: Secondary | ICD-10-CM | POA: Insufficient documentation

## 2016-06-03 DIAGNOSIS — Z7984 Long term (current) use of oral hypoglycemic drugs: Secondary | ICD-10-CM | POA: Diagnosis not present

## 2016-06-03 DIAGNOSIS — I10 Essential (primary) hypertension: Secondary | ICD-10-CM | POA: Insufficient documentation

## 2016-06-03 DIAGNOSIS — K529 Noninfective gastroenteritis and colitis, unspecified: Secondary | ICD-10-CM | POA: Insufficient documentation

## 2016-06-03 DIAGNOSIS — N3000 Acute cystitis without hematuria: Secondary | ICD-10-CM

## 2016-06-03 DIAGNOSIS — Z7982 Long term (current) use of aspirin: Secondary | ICD-10-CM | POA: Insufficient documentation

## 2016-06-03 DIAGNOSIS — R1033 Periumbilical pain: Secondary | ICD-10-CM | POA: Diagnosis present

## 2016-06-03 LAB — COMPREHENSIVE METABOLIC PANEL
ALT: 16 U/L — AB (ref 17–63)
AST: 18 U/L (ref 15–41)
Albumin: 4.3 g/dL (ref 3.5–5.0)
Alkaline Phosphatase: 89 U/L (ref 38–126)
Anion gap: 10 (ref 5–15)
BILIRUBIN TOTAL: 1 mg/dL (ref 0.3–1.2)
BUN: 11 mg/dL (ref 6–20)
CO2: 28 mmol/L (ref 22–32)
CREATININE: 0.96 mg/dL (ref 0.61–1.24)
Calcium: 9.7 mg/dL (ref 8.9–10.3)
Chloride: 103 mmol/L (ref 101–111)
GFR calc Af Amer: >60 mL/min (ref >60)
Glucose, Bld: 219 mg/dL — ABNORMAL HIGH (ref 65–99)
POTASSIUM: 4.5 mmol/L (ref 3.5–5.1)
Sodium: 141 mmol/L (ref 135–145)
TOTAL PROTEIN: 7.8 g/dL (ref 6.5–8.1)

## 2016-06-03 LAB — CBC
HCT: 44.9 % (ref 39.0–52.0)
Hemoglobin: 14.5 g/dL (ref 13.0–17.0)
MCH: 27 pg (ref 26.0–34.0)
MCHC: 32.3 g/dL (ref 30.0–36.0)
MCV: 83.5 fL (ref 78.0–100.0)
PLATELETS: 349 10*3/uL (ref 150–400)
RBC: 5.38 MIL/uL (ref 4.22–5.81)
RDW: 14 % (ref 11.5–15.5)
WBC: 12.5 10*3/uL — ABNORMAL HIGH (ref 4.0–10.5)

## 2016-06-03 LAB — URINALYSIS, ROUTINE W REFLEX MICROSCOPIC
Bilirubin Urine: NEGATIVE
GLUCOSE, UA: 50 mg/dL — AB
Hgb urine dipstick: NEGATIVE
Ketones, ur: 20 mg/dL — AB
NITRITE: NEGATIVE
PH: 5 (ref 5.0–8.0)
Protein, ur: 30 mg/dL — AB
SPECIFIC GRAVITY, URINE: 1.028 (ref 1.005–1.030)

## 2016-06-03 LAB — LIPASE, BLOOD: Lipase: 18 U/L (ref 11–51)

## 2016-06-03 IMAGING — CT CT ABD-PELV W/O CM
2 of 4 series · 15 of 46 positions shown, 17 images · non-contrast
Comparison: CT of the abdomen and pelvis from 05/04/2014, and from
05/24/2006

CLINICAL DATA: Acute onset of lower mid abdominal pain. Nausea and
vomiting. Fever and chills. Initial encounter.

EXAM:
CT ABDOMEN AND PELVIS WITHOUT CONTRAST
TECHNIQUE: Multidetector CT imaging of the abdomen and pelvis was performed
following the standard protocol without IV contrast.

[Series 3: a/p w/o 5mm · axial · non-contrast · 0.80mm/px · z∈[+691,+1171]mm · 12 of 110 slices shown, 14 images]
[im 9/110  soft-tissue]
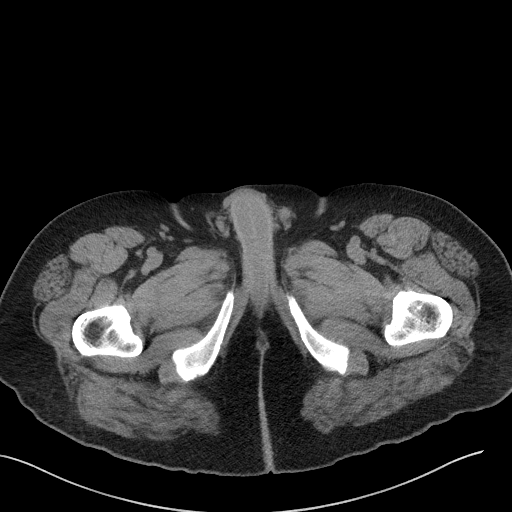
[im 9/110  bone]
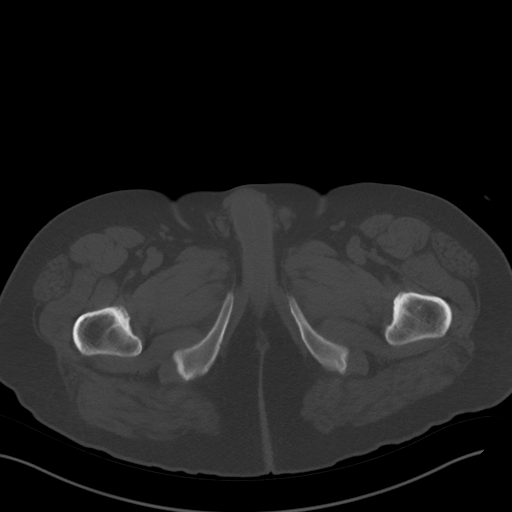
[im 18/110  soft-tissue]
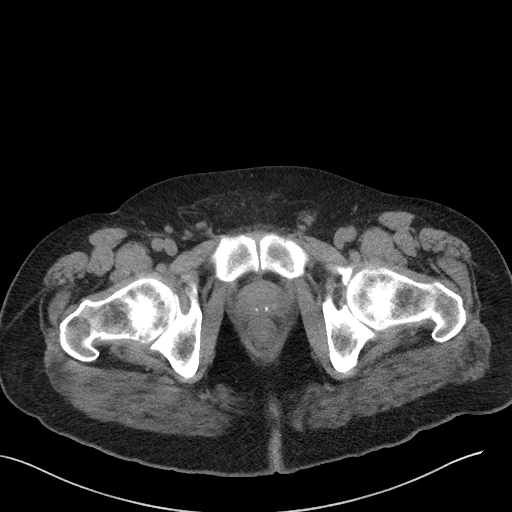
[im 27/110  soft-tissue]
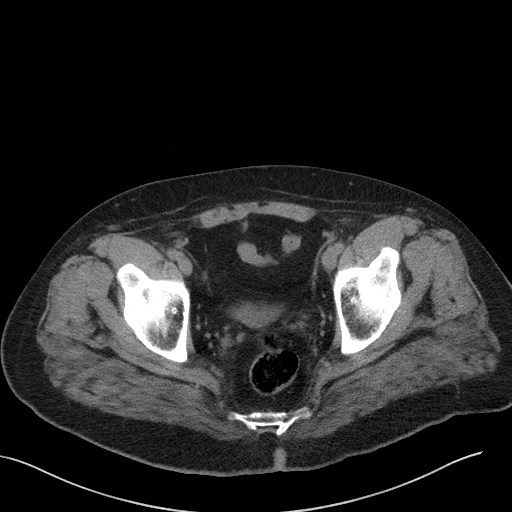
[im 35/110  soft-tissue]
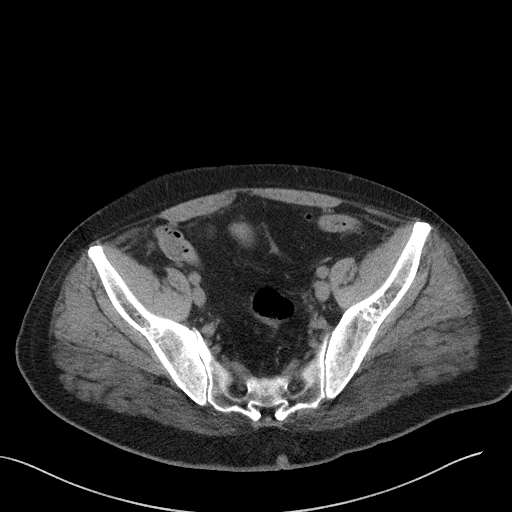
[im 44/110  soft-tissue]
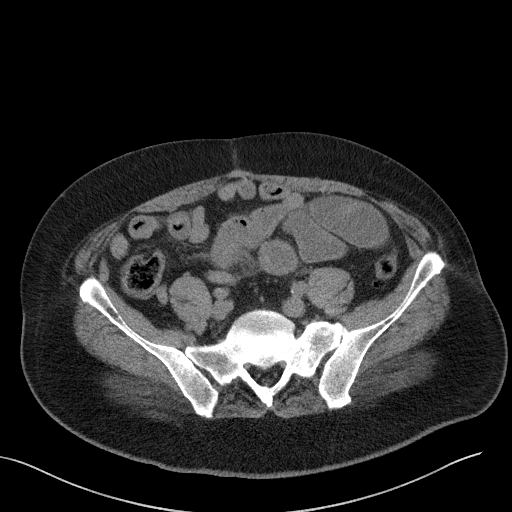
[im 53/110  soft-tissue]
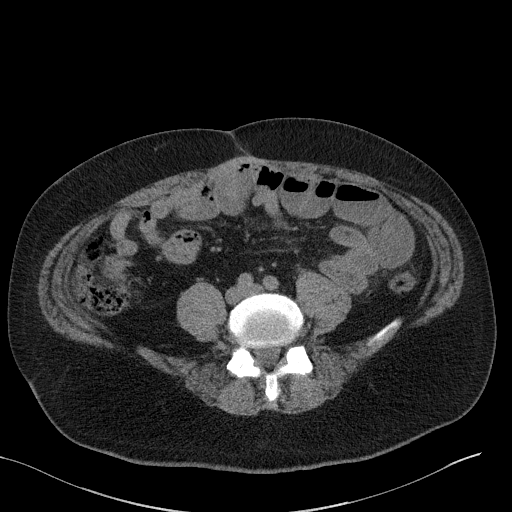
[im 62/110  soft-tissue]
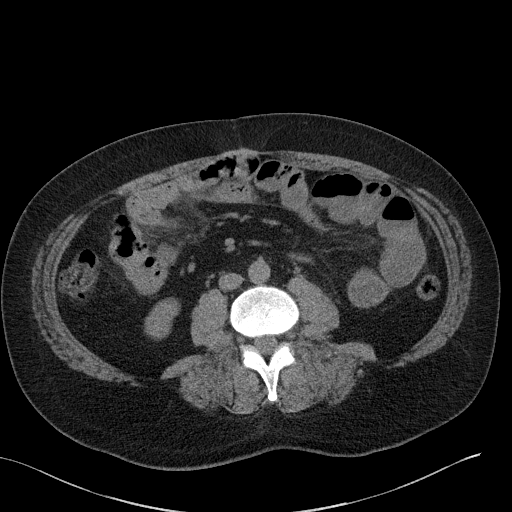
[im 70/110  soft-tissue]
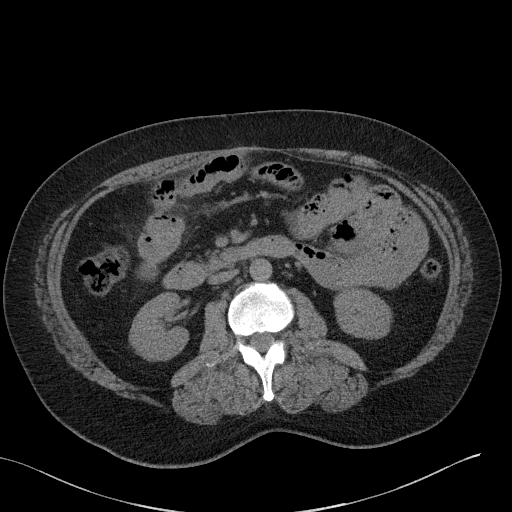
[im 79/110  soft-tissue]
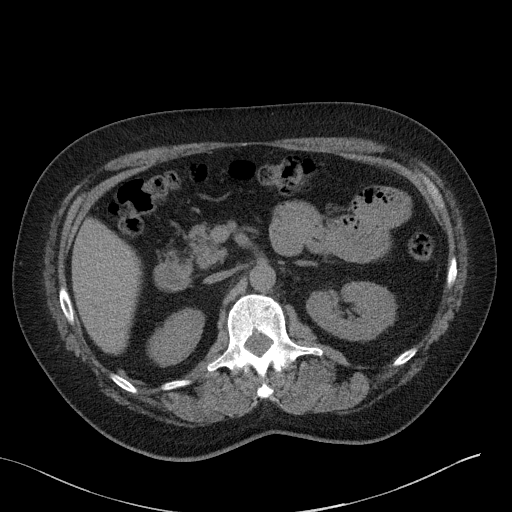
[im 79/110  bone]
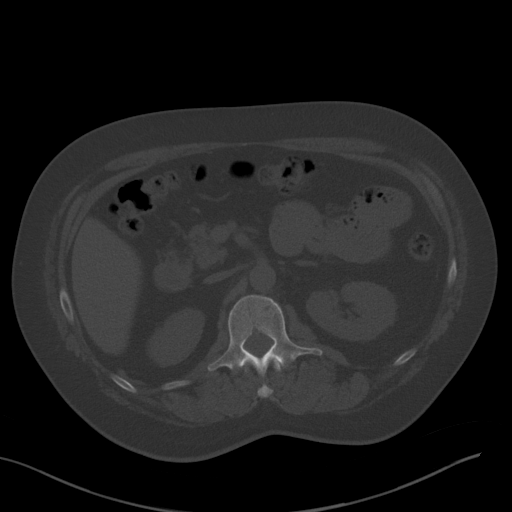
[im 88/110  soft-tissue]
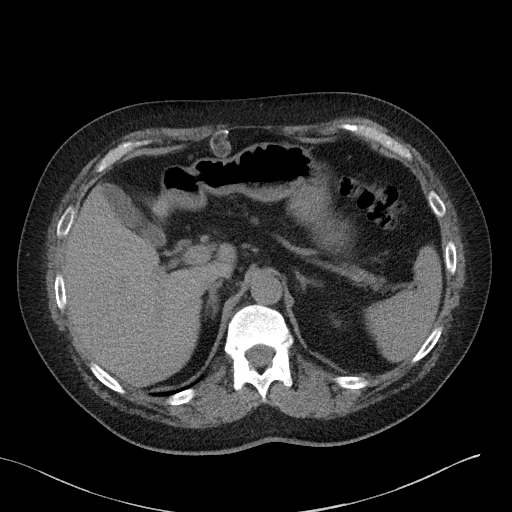
[im 96/110  soft-tissue]
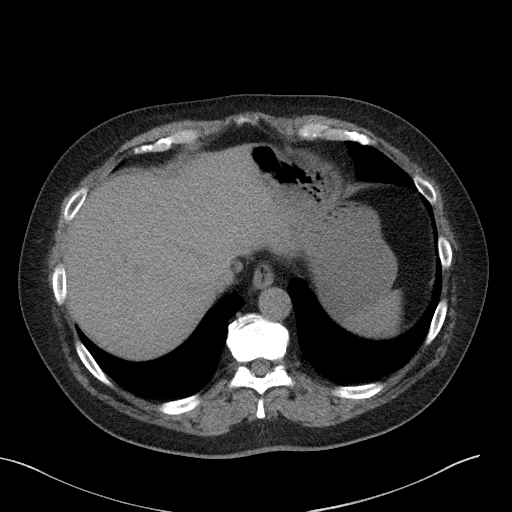
[im 105/110  soft-tissue]
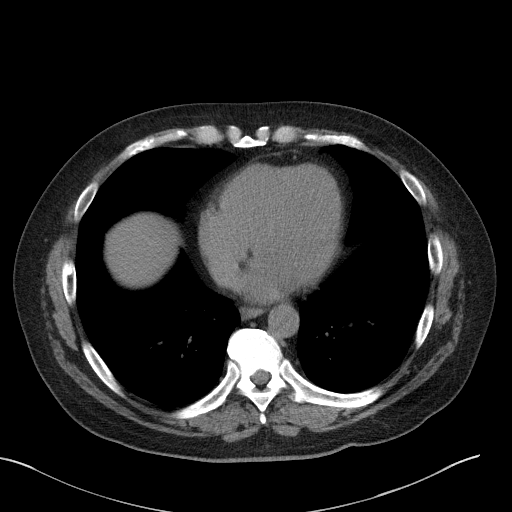

[Series 6: a/p w/o cor · coronal · non-contrast · 0.84mm/px · 3 of 141 slices shown]
[im 47/141  soft-tissue]
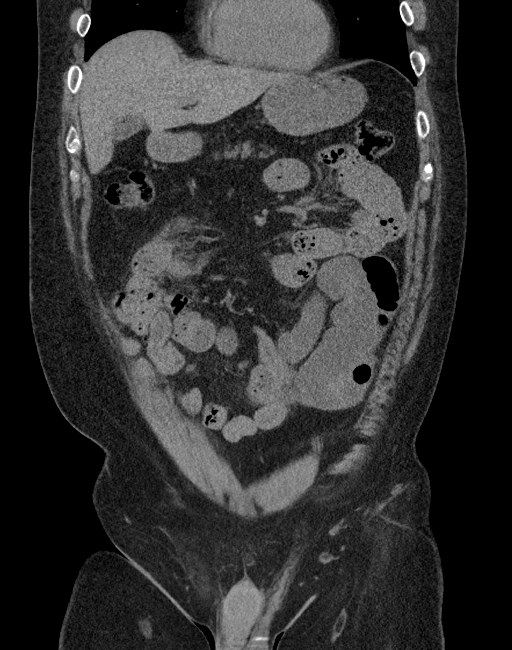
[im 63/141  soft-tissue]
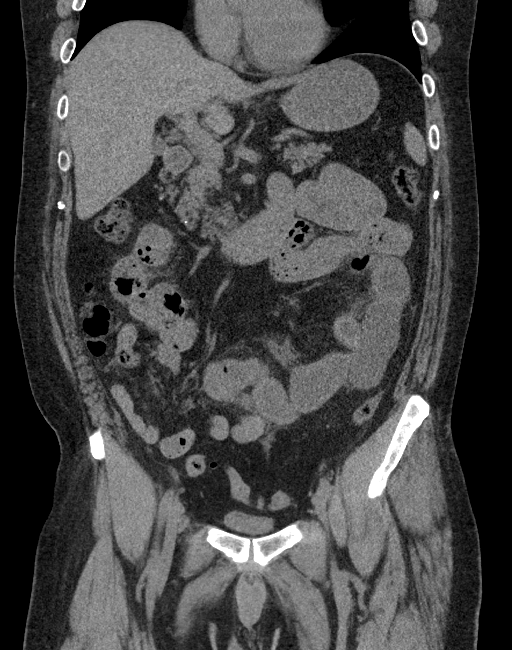
[im 78/141  soft-tissue]
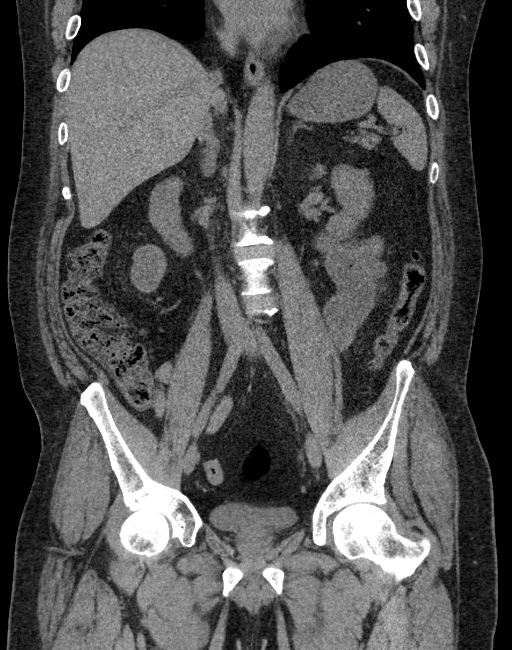

[15 of 46 positions shown; findings below may reference images not displayed]

FINDINGS: Lower chest: The visualized lung bases are grossly clear. The
visualized portions of the mediastinum are unremarkable.

Hepatobiliary: The liver is unremarkable in appearance. The
gallbladder is unremarkable in appearance. The common bile duct
remains normal in caliber.

Pancreas: The pancreas is within normal limits.

Spleen: The spleen is unremarkable in appearance.

Adrenals/Urinary Tract: The adrenal glands are unremarkable in
appearance. The kidneys are within normal limits. There is no
evidence of hydronephrosis. No renal or ureteral stones are
identified. Minimal nonspecific perinephric stranding is noted
bilaterally.

Stomach/Bowel: The stomach is unremarkable in appearance. The small
bowel is within normal limits

Mild soft tissue inflammation is seen tracking about multiple small
bowel loops, with associated mesenteric edema. This was present in a
slightly different distribution in 8590, and minimal mesenteric
changes were seen in 8335. This may reflect a recurrent
enteritis/mesenteritis, infectious or inflammatory in nature.
Underlying small mesenteric hernias cannot be entirely excluded.

The appendix is normal in caliber, without evidence of appendicitis.

Scattered diverticulosis is noted along the descending and proximal
sigmoid colon, without evidence of diverticulitis.

Vascular/Lymphatic: The abdominal aorta is unremarkable in
appearance. The inferior vena cava is grossly unremarkable. No
retroperitoneal lymphadenopathy is seen. No pelvic sidewall
lymphadenopathy is identified.

Reproductive: The bladder is mildly distended and grossly
unremarkable. The prostate remains normal in size, with scattered
calcification.

Other: An omental calcification is noted anterior to the antrum of
the stomach, stable from 8590.

Musculoskeletal: No acute osseous abnormalities are identified. The
visualized musculature is unremarkable in appearance.
IMPRESSION: 1. Mild soft tissue inflammation tracks about multiple small bowel
loops, with associated mesenteric edema. This was present in a
slightly different distribution in 8590, and minimal mesenteric
changes were seen in 8335. This may reflect a recurrent
enteritis/mesenteritis, infectious or inflammatory in nature.
Underlying small mesenteric hernias cannot be entirely excluded.
2. Scattered diverticulosis along the descending and proximal
sigmoid colon, without evidence of diverticulitis.

## 2016-06-03 MED ORDER — HYDROCODONE-ACETAMINOPHEN 7.5-325 MG PO TABS
1.0000 | ORAL_TABLET | Freq: Once | ORAL | Status: DC
  Filled 2016-06-03: qty 1

## 2016-06-03 MED ORDER — HYDROCODONE-ACETAMINOPHEN 5-325 MG PO TABS: 1.0000 | ORAL_TABLET | Freq: Four times a day (QID) | ORAL | 0 refills | Status: AC | PRN

## 2016-06-03 MED ORDER — CIPROFLOXACIN HCL 500 MG PO TABS: 500.0000 mg | ORAL_TABLET | Freq: Two times a day (BID) | ORAL | 0 refills | Status: AC

## 2016-06-03 MED ORDER — ONDANSETRON 4 MG PO TBDP
ORAL_TABLET | ORAL | Status: AC
  Filled 2016-06-03: qty 1

## 2016-06-03 MED ORDER — METRONIDAZOLE 500 MG PO TABS: 500.0000 mg | ORAL_TABLET | Freq: Two times a day (BID) | ORAL | 0 refills | Status: AC

## 2016-06-03 MED ORDER — ONDANSETRON 4 MG PO TBDP
4.0000 mg | ORAL_TABLET | Freq: Once | ORAL | Status: AC
  Administered 2016-06-03: 4 mg via ORAL

## 2016-06-03 NOTE — ED Triage Notes (Signed)
Pt complaining of lower mid abdominal pain. Pt states N/V x 3 today. Pt denies any urinary symptoms or diarrhea. Pt states some fevers/chills.

## 2016-06-03 NOTE — ED Notes (Signed)
Patient transported to CT 

## 2016-06-03 NOTE — ED Notes (Signed)
ED Provider at bedside. 

## 2016-06-03 NOTE — ED Notes (Signed)
Dr. Wickline at bedside.  

## 2016-06-03 NOTE — Discharge Instructions (Signed)
PLEASE CHECK YOUR SUGAR FREQUENTLY TO MAKE SURE IT DOES NOT DROP WHILE ON MEDICATIONS   Abdominal (belly) pain can be caused by many things. any cases can be observed and treated at home after initial evaluation in the emergency department. Even though you are being discharged home, abdominal pain can be unpredictable. Therefore, you need a repeated exam if your pain does not resolve, returns, or worsens. Most patients with abdominal pain don't have to be admitted to the hospital or have surgery, but serious problems like appendicitis and gallbladder attacks can start out as nonspecific pain. Many abdominal conditions cannot be diagnosed in one visit, so follow-up evaluations are very important. SEEK IMMEDIATE MEDICAL ATTENTION IF: The pain does not go away or becomes severe, particularly over the next 8-12 hours.  A temperature above 100.37F develops.  Repeated vomiting occurs (multiple episodes).  The pain becomes localized to portions of the abdomen. The right side could possibly be appendicitis. In an adult, the left lower portion of the abdomen could be colitis or diverticulitis.  Blood is being passed in stools or vomit (bright red or black tarry stools).  Return also if you develop chest pain, difficulty breathing, dizziness or fainting, or become confused, poorly responsive, or inconsolable.
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
## 2016-06-04 ENCOUNTER — Ambulatory Visit (HOSPITAL_COMMUNITY)
Admission: RE | Admit: 2016-06-04 | Discharge: 2016-06-04 | Disposition: A | Discharge status: Home / Self Care | Payer: 59 | Source: Ambulatory Visit | Attending: Cardiology | Admitting: Cardiology

## 2016-06-04 ENCOUNTER — Encounter (HOSPITAL_COMMUNITY)
Admission: RE | Disposition: A | Discharge status: Home / Self Care | Payer: Self-pay | Source: Ambulatory Visit | Attending: Cardiology

## 2016-06-04 DIAGNOSIS — I25110 Atherosclerotic heart disease of native coronary artery with unstable angina pectoris: Secondary | ICD-10-CM | POA: Diagnosis not present

## 2016-06-04 DIAGNOSIS — I200 Unstable angina: Secondary | ICD-10-CM | POA: Diagnosis present

## 2016-06-04 HISTORY — PX: LEFT HEART CATH AND CORONARY ANGIOGRAPHY: CATH118249

## 2016-06-04 LAB — URINE CULTURE: Culture: <10000 — AB

## 2016-06-04 LAB — GLUCOSE, CAPILLARY
GLUCOSE-CAPILLARY: 127 mg/dL — AB (ref 65–99)
Glucose-Capillary: 89 mg/dL (ref 65–99)

## 2016-06-04 SURGERY — LEFT HEART CATH AND CORONARY ANGIOGRAPHY
Anesthesia: LOCAL

## 2016-06-04 MED ORDER — SODIUM CHLORIDE 0.9% FLUSH: 3.0000 mL | Freq: Two times a day (BID) | INTRAVENOUS | Status: DC

## 2016-06-04 MED ORDER — IOPAMIDOL (ISOVUE-370) INJECTION 76%
INTRAVENOUS | Status: DC | PRN
  Administered 2016-06-04: 75 mL via INTRA_ARTERIAL

## 2016-06-04 MED ORDER — SODIUM CHLORIDE 0.9 % IV SOLN
INTRAVENOUS | Status: DC | PRN
  Administered 2016-06-04: 500 mL/h via INTRAVENOUS

## 2016-06-04 MED ORDER — MIDAZOLAM HCL 2 MG/2ML IJ SOLN
INTRAMUSCULAR | Status: AC
  Filled 2016-06-04: qty 2

## 2016-06-04 MED ORDER — HEPARIN (PORCINE) IN NACL 2-0.9 UNIT/ML-% IJ SOLN
INTRAMUSCULAR | Status: AC
  Filled 2016-06-04: qty 1000

## 2016-06-04 MED ORDER — SODIUM CHLORIDE 0.9 % WEIGHT BASED INFUSION
3.0000 mL/kg/h | INTRAVENOUS | Status: DC
  Administered 2016-06-04: 3 mL/kg/h via INTRAVENOUS

## 2016-06-04 MED ORDER — SODIUM CHLORIDE 0.9 % IV SOLN: 250.0000 mL | INTRAVENOUS | Status: DC | PRN

## 2016-06-04 MED ORDER — ASPIRIN 81 MG PO CHEW: 81.0000 mg | CHEWABLE_TABLET | ORAL | Status: DC

## 2016-06-04 MED ORDER — SODIUM CHLORIDE 0.9% FLUSH: 3.0000 mL | INTRAVENOUS | Status: DC | PRN

## 2016-06-04 MED ORDER — CLOPIDOGREL BISULFATE 75 MG PO TABS
75.0000 mg | ORAL_TABLET | Freq: Once | ORAL | Status: AC
  Administered 2016-06-04: 75 mg via ORAL

## 2016-06-04 MED ORDER — LIDOCAINE HCL (PF) 1 % IJ SOLN
INTRAMUSCULAR | Status: DC | PRN
  Administered 2016-06-04: 15 mL via SUBCUTANEOUS

## 2016-06-04 MED ORDER — HEPARIN (PORCINE) IN NACL 2-0.9 UNIT/ML-% IJ SOLN
INTRAMUSCULAR | Status: DC | PRN
  Administered 2016-06-04: 1000 mL

## 2016-06-04 MED ORDER — ONDANSETRON HCL 4 MG/2ML IJ SOLN
4.0000 mg | Freq: Four times a day (QID) | INTRAMUSCULAR | Status: DC | PRN
Start: 2016-06-04 — End: 2016-06-04

## 2016-06-04 MED ORDER — ACETAMINOPHEN 325 MG PO TABS: 650.0000 mg | ORAL_TABLET | ORAL | Status: DC | PRN

## 2016-06-04 MED ORDER — FENTANYL CITRATE (PF) 100 MCG/2ML IJ SOLN
INTRAMUSCULAR | Status: AC
  Filled 2016-06-04: qty 2

## 2016-06-04 MED ORDER — MIDAZOLAM HCL 2 MG/2ML IJ SOLN
INTRAMUSCULAR | Status: DC | PRN
  Administered 2016-06-04 (×2): 1 mg via INTRAVENOUS

## 2016-06-04 MED ORDER — SODIUM CHLORIDE 0.9 % WEIGHT BASED INFUSION: 1.0000 mL/kg/h | INTRAVENOUS | Status: DC

## 2016-06-04 MED ORDER — IOPAMIDOL (ISOVUE-370) INJECTION 76%
INTRAVENOUS | Status: AC
  Filled 2016-06-04: qty 100

## 2016-06-04 MED ORDER — NITROGLYCERIN 1 MG/10 ML FOR IR/CATH LAB
INTRA_ARTERIAL | Status: AC
Start: 2016-06-04 — End: ?
  Filled 2016-06-04: qty 10

## 2016-06-04 MED ORDER — LIDOCAINE HCL (PF) 1 % IJ SOLN
INTRAMUSCULAR | Status: AC
  Filled 2016-06-04: qty 30

## 2016-06-04 MED ORDER — CLOPIDOGREL BISULFATE 75 MG PO TABS
ORAL_TABLET | ORAL | Status: AC
  Administered 2016-06-04: 75 mg via ORAL
  Filled 2016-06-04: qty 1

## 2016-06-04 MED ORDER — SODIUM CHLORIDE 0.9 % IV SOLN
INTRAVENOUS | Status: AC
  Administered 2016-06-04: 150 mL/h via INTRAVENOUS

## 2016-06-04 MED ORDER — FENTANYL CITRATE (PF) 100 MCG/2ML IJ SOLN
INTRAMUSCULAR | Status: DC | PRN
  Administered 2016-06-04 (×2): 25 ug via INTRAVENOUS

## 2016-06-04 SURGICAL SUPPLY — 7 items
CATH INFINITI 5FR MULTPACK ANG (CATHETERS) ×2 IMPLANT
KIT HEART LEFT (KITS) ×2 IMPLANT
PACK CARDIAC CATHETERIZATION (CUSTOM PROCEDURE TRAY) ×2 IMPLANT
SHEATH PINNACLE 5F 10CM (SHEATH) ×2 IMPLANT
SYR MEDRAD MARK V 150ML (SYRINGE) ×2 IMPLANT
TRANSDUCER W/STOPCOCK (MISCELLANEOUS) ×2 IMPLANT
WIRE EMERALD 3MM-J .035X150CM (WIRE) ×2 IMPLANT

## 2016-06-04 NOTE — Discharge Instructions (Signed)
Femoral Site Care °Refer to this sheet in the next few weeks. These instructions provide you with information about caring for yourself after your procedure. Your health care provider may also give you more specific instructions. Your treatment has been planned according to current medical practices, but problems sometimes occur. Call your health care provider if you have any problems or questions after your procedure. °What can I expect after the procedure? °After your procedure, it is typical to have the following: °· Bruising at the site that usually fades within 1-2 weeks. °· Blood collecting in the tissue (hematoma) that may be painful to the touch. It should usually decrease in size and tenderness within 1-2 weeks. °Follow these instructions at home: °· Take medicines only as directed by your health care provider. °· You may shower 24-48 hours after the procedure or as directed by your health care provider. Remove the bandage (dressing) and gently wash the site with plain soap and water. Pat the area dry with a clean towel. Do not rub the site, because this may cause bleeding. °· Do not take baths, swim, or use a hot tub until your health care provider approves. °· Check your insertion site every day for redness, swelling, or drainage. °· Do not apply powder or lotion to the site. °· Limit use of stairs to twice a day for the first 2-3 days or as directed by your health care provider. °· Do not squat for the first 2-3 days or as directed by your health care provider. °· Do not lift over 10 lb (4.5 kg) for 5 days after your procedure or as directed by your health care provider. °· Ask your health care provider when it is okay to: °¨ Return to work or school. °¨ Resume usual physical activities or sports. °¨ Resume sexual activity. °· Do not drive home if you are discharged the same day as the procedure. Have someone else drive you. °· You may drive 24 hours after the procedure unless otherwise instructed by  your health care provider. °· Do not operate machinery or power tools for 24 hours after the procedure or as directed by your health care provider. °· If your procedure was done as an outpatient procedure, which means that you went home the same day as your procedure, a responsible adult should be with you for the first 24 hours after you arrive home. °· Keep all follow-up visits as directed by your health care provider. This is important. °Contact a health care provider if: °· You have a fever. °· You have chills. °· You have increased bleeding from the site. Hold pressure on the site. °Get help right away if: °· You have unusual pain at the site. °· You have redness, warmth, or swelling at the site. °· You have drainage (other than a small amount of blood on the dressing) from the site. °· The site is bleeding, and the bleeding does not stop after 30 minutes of holding steady pressure on the site. °· Your leg or foot becomes pale, cool, tingly, or numb. °This information is not intended to replace advice given to you by your health care provider. Make sure you discuss any questions you have with your health care provider. °Document Released: 10/29/2013 Document Revised: 08/03/2015 Document Reviewed: 09/14/2013 °Elsevier Interactive Patient Education © 2017 [employer] ° °

## 2016-06-04 NOTE — H&P (Signed)
Dictated H&P in the chart needs to be scanned 

## 2016-06-04 NOTE — Interval H&P Note (Signed)
Cath Lab Visit (complete for each Cath Lab visit)  Clinical Evaluation Leading to the Procedure:   ACS: No.  Non-ACS:    Anginal Classification: CCS IV  Anti-ischemic medical therapy: Maximal Therapy (2 or more classes of medications)  Non-Invasive Test Results: No non-invasive testing performed  Prior CABG: No previous CABG      History and Physical Interval Note:  06/04/2016 10:18 AM  [name] Brooks  has presented today for surgery, with the diagnosis of Chest Pain   The various methods of treatment have been discussed with the patient and family. After consideration of risks, benefits and other options for treatment, the patient has consented to  Procedure(s): Left Heart Cath and Coronary Angiography (N/A) as a surgical intervention .  The patient's history has been reviewed, patient examined, no change in status, stable for surgery.  I have reviewed the patient's chart and labs.  Questions were answered to the patient's satisfaction.     Rinaldo CloudHarwani, Maksim Peregoy

## 2016-06-04 NOTE — Progress Notes (Signed)
Site area: Right groin a 5 french arterialheath was removed  Site Prior to Removal:  Level 0  Pressure Applied For 15 MINUTES    Bedrest Beginning at 1130p  Manual:   Yes.    Patient Status During Pull:  stable  Post Pull Groin Site:  Level 0  Post Pull Instructions Given:  Yes.    Post Pull Pulses Present:  Yes.    Dressing Applied:  Yes.    Comments:  VS remain during sheath pull.

## 2016-06-05 ENCOUNTER — Encounter (HOSPITAL_COMMUNITY): Payer: Self-pay | Admitting: Cardiology

## 2016-06-05 MED FILL — Nitroglycerin IV Soln 100 MCG/ML in D5W: INTRA_ARTERIAL | Qty: 10 | Status: AC

## 2016-06-14 ENCOUNTER — Other Ambulatory Visit: Payer: Self-pay

## 2016-06-14 MED ORDER — ZOLPIDEM TARTRATE ER 12.5 MG PO TBCR: 12.5000 mg | EXTENDED_RELEASE_TABLET | Freq: Every evening | ORAL | 0 refills | Status: DC | PRN

## 2016-06-14 NOTE — Telephone Encounter (Signed)
Ok to phone in Ambien 

## 2016-06-14 NOTE — Telephone Encounter (Signed)
Rx called in to requested pharmacy 

## 2016-06-14 NOTE — Telephone Encounter (Signed)
Pt left v/m requesting refill zolpidem ( last refilled # 30 on 05/15/16) last seen 03/07/16. [facility] [address]

## 2016-07-18 ENCOUNTER — Other Ambulatory Visit: Payer: Self-pay

## 2016-07-18 NOTE — Telephone Encounter (Signed)
Pt left v/m requesting refill zolpidem to [facility] [address] Last refilled # 30 on 06/14/16 and last seen 03/07/16.

## 2016-07-19 MED ORDER — ZOLPIDEM TARTRATE ER 12.5 MG PO TBCR: 12.5000 mg | EXTENDED_RELEASE_TABLET | Freq: Every evening | ORAL | 0 refills | Status: DC | PRN

## 2016-07-19 NOTE — Telephone Encounter (Signed)
Rx called in to pharmacy. 

## 2016-07-19 NOTE — Telephone Encounter (Signed)
Ok to phone in Ambien 

## 2016-07-21 ENCOUNTER — Other Ambulatory Visit: Payer: Self-pay | Admitting: Internal Medicine

## 2016-08-19 ENCOUNTER — Other Ambulatory Visit: Payer: Self-pay

## 2016-08-19 MED ORDER — ZOLPIDEM TARTRATE ER 12.5 MG PO TBCR: 12.5000 mg | EXTENDED_RELEASE_TABLET | Freq: Every evening | ORAL | 0 refills | Status: DC | PRN

## 2016-08-19 NOTE — Telephone Encounter (Signed)
Rx called in to pharmacy. 

## 2016-08-19 NOTE — Telephone Encounter (Signed)
Ok to phone in ambien 

## 2016-08-19 NOTE — Telephone Encounter (Signed)
Pt left v/m requesting refill zolpidem to [facility] [address] Last refilled # 30 on 07/19/16. Pt last seen 03/07/16.

## 2016-08-31 ENCOUNTER — Other Ambulatory Visit: Payer: Self-pay | Admitting: Internal Medicine

## 2016-09-17 ENCOUNTER — Other Ambulatory Visit: Payer: Self-pay

## 2016-09-17 MED ORDER — ZOLPIDEM TARTRATE ER 12.5 MG PO TBCR: 12.5000 mg | EXTENDED_RELEASE_TABLET | Freq: Every evening | ORAL | 0 refills | Status: DC | PRN

## 2016-09-17 NOTE — Telephone Encounter (Signed)
Ok to phone in Ambien 

## 2016-09-17 NOTE — Telephone Encounter (Signed)
Pt left v/m requesting refill zolpidem to [facility] [address] Last refilled # 30 on 08/19/16. Last seen 03/07/16.

## 2016-09-17 NOTE — Telephone Encounter (Signed)
Rx called in to pharmacy. 

## 2016-09-18 ENCOUNTER — Other Ambulatory Visit: Payer: Self-pay | Admitting: Internal Medicine

## 2016-10-16 ENCOUNTER — Other Ambulatory Visit: Payer: Self-pay

## 2016-10-16 MED ORDER — ZOLPIDEM TARTRATE ER 12.5 MG PO TBCR: 12.5000 mg | EXTENDED_RELEASE_TABLET | Freq: Every evening | ORAL | 0 refills | Status: DC | PRN

## 2016-10-16 NOTE — Telephone Encounter (Signed)
Pt left v/m requesting refill zolpidem to [facility] [address] Last refilled # 30 on 09/17/16. Last seen 03/07/16. Pamala Hurry Baity NP out of office.Please advise.

## 2016-10-17 NOTE — Telephone Encounter (Signed)
Rx called in to pharmacy. 

## 2016-10-22 ENCOUNTER — Other Ambulatory Visit: Payer: Self-pay | Admitting: Internal Medicine

## 2016-11-18 ENCOUNTER — Other Ambulatory Visit: Payer: Self-pay | Admitting: *Deleted

## 2016-11-18 MED ORDER — ZOLPIDEM TARTRATE ER 12.5 MG PO TBCR: 12.5000 mg | EXTENDED_RELEASE_TABLET | Freq: Every evening | ORAL | 0 refills | Status: AC | PRN

## 2016-11-18 NOTE — Telephone Encounter (Signed)
Rx called in to pharmacy. 

## 2016-11-18 NOTE — Telephone Encounter (Signed)
Ok to phone in Ambien 

## 2016-11-18 NOTE — Telephone Encounter (Signed)
Patient left a voicemail requesting a refill on Ambien Last refill 10/16/16 #30 Last office visit 03/07/16

## 2016-11-27 ENCOUNTER — Other Ambulatory Visit: Payer: Self-pay | Admitting: Internal Medicine

## 2016-11-28 NOTE — Telephone Encounter (Signed)
Letter mailed letting pt know he will need to schedule an annual exam for further refills

## 2016-12-09 DEATH — deceased

## 2016-12-25 ENCOUNTER — Other Ambulatory Visit: Payer: Self-pay | Admitting: Internal Medicine

## 2017-01-20 ENCOUNTER — Other Ambulatory Visit: Payer: Self-pay | Admitting: Internal Medicine
